# Patient Record
Sex: Female | Born: 1950 | Race: Black or African American | Hispanic: No | Smoking: Never smoker
Health system: Southern US, Community
[De-identification: ages and names within clinical notes are randomized; demographics above are authoritative.]

## PROBLEM LIST (undated history)

## (undated) DIAGNOSIS — I10 Essential (primary) hypertension: Secondary | ICD-10-CM

## (undated) DIAGNOSIS — G4730 Sleep apnea, unspecified: Secondary | ICD-10-CM

## (undated) DIAGNOSIS — E7800 Pure hypercholesterolemia, unspecified: Secondary | ICD-10-CM

## (undated) DIAGNOSIS — E559 Vitamin D deficiency, unspecified: Secondary | ICD-10-CM

## (undated) DIAGNOSIS — E119 Type 2 diabetes mellitus without complications: Secondary | ICD-10-CM

## (undated) HISTORY — PX: CHOLECYSTECTOMY: SHX55

## (undated) HISTORY — PX: OTHER SURGICAL HISTORY: SHX169

## (undated) HISTORY — PX: ABDOMINAL HYSTERECTOMY: SHX81

## (undated) HISTORY — PX: KNEE SURGERY: SHX244

---

## 2012-04-23 ENCOUNTER — Encounter (HOSPITAL_BASED_OUTPATIENT_CLINIC_OR_DEPARTMENT_OTHER): Payer: Self-pay | Admitting: *Deleted

## 2012-04-23 ENCOUNTER — Emergency Department (HOSPITAL_BASED_OUTPATIENT_CLINIC_OR_DEPARTMENT_OTHER)
Admission: EM | Admit: 2012-04-23 | Discharge: 2012-04-23 | Disposition: A | Discharge status: Home / Self Care | Payer: Medicare Other | Attending: Emergency Medicine | Admitting: Emergency Medicine

## 2012-04-23 DIAGNOSIS — Z79899 Other long term (current) drug therapy: Secondary | ICD-10-CM | POA: Insufficient documentation

## 2012-04-23 DIAGNOSIS — B029 Zoster without complications: Secondary | ICD-10-CM | POA: Insufficient documentation

## 2012-04-23 MED ORDER — MORPHINE SULFATE 4 MG/ML IJ SOLN
4.0000 mg | Freq: Once | INTRAMUSCULAR | Status: AC
  Administered 2012-04-23: 4 mg via INTRAMUSCULAR
  Filled 2012-04-23: qty 1

## 2012-04-23 MED ORDER — OXYCODONE-ACETAMINOPHEN 5-325 MG PO TABS: 1.0000 | ORAL_TABLET | ORAL | Status: AC | PRN

## 2012-04-23 NOTE — ED Notes (Signed)
Pain in her left upper quad around to her back x 2 weeks. She developed a rash and was diagnosed with shingles by her MD. She is taking Zovarax, Vicodin and Flexeril with no relief.

## 2012-04-23 NOTE — ED Provider Notes (Signed)
History/physical exam/procedure(s) were performed by non-physician practitioner and as supervising physician I was immediately available for consultation/collaboration. I have reviewed all notes and am in agreement with care and plan.   Danielle S Ray, MD 04/23/12 1448 

## 2012-04-23 NOTE — ED Provider Notes (Signed)
History     CSN: [id number]  Arrival date & time 04/23/12  1104   First MD Initiated Contact with Patient 04/23/12 1153      Chief Complaint  Patient presents with  . Rash    (Consider location/radiation/quality/duration/timing/severity/associated sxs/prior treatment) HPI Comments: Pt states that Cindy Ashley has been having pain times 2 weeks:pt states that initially Cindy Ashley was told that it was muscle strain and then Cindy Ashley was diagnosed with shingles and placed on valtrex, and vicodin:pt states that the pain medication is not working  Patient is a 62 y.o. female presenting with rash.  Rash Location:  Torso Torso rash location:  L flank Quality: blistering and painful   Pain details:    Quality:  Aching   Severity:  Severe   Duration:  2 weeks   No past medical history on file.  Past Surgical History  Procedure Laterality Date  . Abdominal hysterectomy    . Cholecystectomy    . Knee surgery    . Carpel tunnel      No family history on file.  History  Substance Use Topics  . Smoking status: Never Smoker   . Smokeless tobacco: Not on file  . Alcohol Use: Yes    OB History   Grav Para Term Preterm Abortions TAB SAB Ect Mult Living                  Review of Systems  Constitutional: Negative.   Respiratory: Negative.   Cardiovascular: Negative.   Skin: Positive for rash.    Allergies  Penicillins  Home Medications   Current Outpatient Rx  Name  Route  Sig  Dispense  Refill  . Acyclovir (ZOVIRAX PO)   Oral   Take by mouth.         . Cyclobenzaprine HCl (FLEXERIL PO)   Oral   Take by mouth.         . Hydrocodone-Acetaminophen (VICODIN PO)   Oral   Take by mouth.         Marland Kitchen LISINOPRIL PO   Oral   Take by mouth.         . Pitavastatin Calcium (LIVALO PO)   Oral   Take by mouth.         . SitaGLIPtin-MetFORMIN HCl (JANUMET PO)   Oral   Take by mouth.         . oxyCODONE-acetaminophen (PERCOCET/ROXICET) 5-325 MG per tablet   Oral   Take  1-2 tablets by mouth every 4 (four) hours as needed for pain.   15 tablet   0     BP 160/87  Pulse 80  Temp(Src) 98.6 F (37 C) (Oral)  Resp 22  Wt 273 lb (123.832 kg)  SpO2 97%  Physical Exam  Nursing note and vitals reviewed. Constitutional: Cindy Ashley is oriented to person, place, and time. Cindy Ashley appears well-developed and well-nourished.  HENT:  Head: Normocephalic and atraumatic.  Cardiovascular: Normal rate and regular rhythm.   Pulmonary/Chest: Effort normal and breath sounds normal.  Musculoskeletal: Normal range of motion.  Neurological: Cindy Ashley is alert and oriented to person, place, and time.  Skin:  Pt has red blistered area to the left flank    ED Course  Procedures (including critical care time)  Labs Reviewed - No data to display No results found.   1. Shingles       MDM  Will change pain medication       Teressa Lower, NP 04/23/12 1229

## 2012-10-30 ENCOUNTER — Other Ambulatory Visit (HOSPITAL_BASED_OUTPATIENT_CLINIC_OR_DEPARTMENT_OTHER): Payer: Self-pay | Admitting: Internal Medicine

## 2012-10-30 DIAGNOSIS — M25511 Pain in right shoulder: Secondary | ICD-10-CM

## 2012-11-02 ENCOUNTER — Other Ambulatory Visit (HOSPITAL_BASED_OUTPATIENT_CLINIC_OR_DEPARTMENT_OTHER): Payer: Medicare Other

## 2012-11-09 ENCOUNTER — Ambulatory Visit (HOSPITAL_BASED_OUTPATIENT_CLINIC_OR_DEPARTMENT_OTHER): Payer: Medicare Other

## 2012-12-03 ENCOUNTER — Ambulatory Visit (HOSPITAL_BASED_OUTPATIENT_CLINIC_OR_DEPARTMENT_OTHER)
Admission: RE | Admit: 2012-12-03 | Discharge: 2012-12-03 | Disposition: A | Discharge status: Home / Self Care | Payer: Medicare Other | Source: Ambulatory Visit | Attending: Internal Medicine | Admitting: Internal Medicine

## 2012-12-03 DIAGNOSIS — M24119 Other articular cartilage disorders, unspecified shoulder: Secondary | ICD-10-CM | POA: Insufficient documentation

## 2012-12-03 DIAGNOSIS — M6740 Ganglion, unspecified site: Secondary | ICD-10-CM | POA: Insufficient documentation

## 2012-12-03 DIAGNOSIS — M19019 Primary osteoarthritis, unspecified shoulder: Secondary | ICD-10-CM | POA: Insufficient documentation

## 2012-12-03 DIAGNOSIS — M25511 Pain in right shoulder: Secondary | ICD-10-CM

## 2012-12-03 DIAGNOSIS — M67919 Unspecified disorder of synovium and tendon, unspecified shoulder: Secondary | ICD-10-CM | POA: Insufficient documentation

## 2012-12-03 DIAGNOSIS — M719 Bursopathy, unspecified: Secondary | ICD-10-CM | POA: Insufficient documentation

## 2014-06-10 ENCOUNTER — Emergency Department (HOSPITAL_BASED_OUTPATIENT_CLINIC_OR_DEPARTMENT_OTHER): Payer: Medicare Other

## 2014-06-10 ENCOUNTER — Emergency Department (HOSPITAL_BASED_OUTPATIENT_CLINIC_OR_DEPARTMENT_OTHER)
Admission: EM | Admit: 2014-06-10 | Discharge: 2014-06-10 | Disposition: A | Discharge status: Home / Self Care | Payer: Medicare Other | Attending: Emergency Medicine | Admitting: Emergency Medicine

## 2014-06-10 ENCOUNTER — Encounter (HOSPITAL_BASED_OUTPATIENT_CLINIC_OR_DEPARTMENT_OTHER): Payer: Self-pay

## 2014-06-10 DIAGNOSIS — E119 Type 2 diabetes mellitus without complications: Secondary | ICD-10-CM | POA: Diagnosis not present

## 2014-06-10 DIAGNOSIS — R109 Unspecified abdominal pain: Secondary | ICD-10-CM | POA: Diagnosis not present

## 2014-06-10 DIAGNOSIS — Z9049 Acquired absence of other specified parts of digestive tract: Secondary | ICD-10-CM | POA: Diagnosis not present

## 2014-06-10 DIAGNOSIS — Z880 Allergy status to penicillin: Secondary | ICD-10-CM | POA: Insufficient documentation

## 2014-06-10 DIAGNOSIS — Z8669 Personal history of other diseases of the nervous system and sense organs: Secondary | ICD-10-CM | POA: Diagnosis not present

## 2014-06-10 DIAGNOSIS — Z90710 Acquired absence of both cervix and uterus: Secondary | ICD-10-CM | POA: Diagnosis not present

## 2014-06-10 DIAGNOSIS — M545 Low back pain: Secondary | ICD-10-CM | POA: Insufficient documentation

## 2014-06-10 DIAGNOSIS — Z794 Long term (current) use of insulin: Secondary | ICD-10-CM | POA: Diagnosis not present

## 2014-06-10 DIAGNOSIS — I10 Essential (primary) hypertension: Secondary | ICD-10-CM | POA: Insufficient documentation

## 2014-06-10 DIAGNOSIS — Z79899 Other long term (current) drug therapy: Secondary | ICD-10-CM | POA: Insufficient documentation

## 2014-06-10 HISTORY — DX: Vitamin D deficiency, unspecified: E55.9

## 2014-06-10 HISTORY — DX: Type 2 diabetes mellitus without complications: E11.9

## 2014-06-10 HISTORY — DX: Essential (primary) hypertension: I10

## 2014-06-10 HISTORY — DX: Sleep apnea, unspecified: G47.30

## 2014-06-10 LAB — CBC WITH DIFFERENTIAL/PLATELET
BASOS ABS: 0 10*3/uL (ref 0.0–0.1)
Basophils Relative: 0 % (ref 0–1)
EOS ABS: 0.1 10*3/uL (ref 0.0–0.7)
Eosinophils Relative: 1 % (ref 0–5)
HCT: 40.1 % (ref 36.0–46.0)
Hemoglobin: 12.7 g/dL (ref 12.0–15.0)
LYMPHS ABS: 2.6 10*3/uL (ref 0.7–4.0)
Lymphocytes Relative: 33 % (ref 12–46)
MCH: 27.3 pg (ref 26.0–34.0)
MCHC: 31.7 g/dL (ref 30.0–36.0)
MCV: 86.1 fL (ref 78.0–100.0)
Monocytes Absolute: 0.6 10*3/uL (ref 0.1–1.0)
Monocytes Relative: 7 % (ref 3–12)
NEUTROS PCT: 59 % (ref 43–77)
Neutro Abs: 4.7 10*3/uL (ref 1.7–7.7)
PLATELETS: 268 10*3/uL (ref 150–400)
RBC: 4.66 MIL/uL (ref 3.87–5.11)
RDW: 14.5 % (ref 11.5–15.5)
WBC: 7.9 10*3/uL (ref 4.0–10.5)

## 2014-06-10 LAB — COMPREHENSIVE METABOLIC PANEL
ALT: 18 U/L (ref 0–35)
AST: 19 U/L (ref 0–37)
Albumin: 3.7 g/dL (ref 3.5–5.2)
Alkaline Phosphatase: 58 U/L (ref 39–117)
Anion gap: 6 (ref 5–15)
BUN: 17 mg/dL (ref 6–23)
CALCIUM: 9 mg/dL (ref 8.4–10.5)
CO2: 26 mmol/L (ref 19–32)
Chloride: 107 mmol/L (ref 96–112)
Creatinine, Ser: 0.78 mg/dL (ref 0.50–1.10)
GFR calc non Af Amer: 86 mL/min — ABNORMAL LOW (ref >90)
GLUCOSE: 130 mg/dL — AB (ref 70–99)
POTASSIUM: 3.7 mmol/L (ref 3.5–5.1)
Sodium: 139 mmol/L (ref 135–145)
TOTAL PROTEIN: 7.5 g/dL (ref 6.0–8.3)
Total Bilirubin: 0.6 mg/dL (ref 0.3–1.2)

## 2014-06-10 LAB — URINALYSIS, ROUTINE W REFLEX MICROSCOPIC
BILIRUBIN URINE: NEGATIVE
Glucose, UA: NEGATIVE mg/dL
Hgb urine dipstick: NEGATIVE
KETONES UR: NEGATIVE mg/dL
LEUKOCYTES UA: NEGATIVE
NITRITE: NEGATIVE
PROTEIN: NEGATIVE mg/dL
Specific Gravity, Urine: 1.03 (ref 1.005–1.030)
Urobilinogen, UA: 0.2 mg/dL (ref 0.0–1.0)
pH: 5 (ref 5.0–8.0)

## 2014-06-10 LAB — CBG MONITORING, ED: Glucose-Capillary: 133 mg/dL — ABNORMAL HIGH (ref 70–99)

## 2014-06-10 LAB — TROPONIN I

## 2014-06-10 NOTE — Discharge Instructions (Signed)
Flank Pain °Flank pain refers to pain that is located on the side of the body between the upper abdomen and the back. The pain may occur over a short period of time (acute) or may be long-term or reoccurring (chronic). It may be mild or severe. Flank pain can be caused by many things. °CAUSES  °Some of the more common causes of flank pain include: °· Muscle strains.   °· Muscle spasms.   °· A disease of your spine (vertebral disk disease).   °· A lung infection (pneumonia).   °· Fluid around your lungs (pulmonary edema).   °· A kidney infection.   °· Kidney stones.   °· A very painful skin rash caused by the chickenpox virus (shingles).   °· Gallbladder disease.   °HOME CARE INSTRUCTIONS  °Home care will depend on the cause of your pain. In general, °· Rest as directed by your caregiver. °· Drink enough fluids to keep your urine clear or pale yellow. °· Only take over-the-counter or prescription medicines as directed by your caregiver. Some medicines may help relieve the pain. °· Tell your caregiver about any changes in your pain. °· Follow up with your caregiver as directed. °SEEK IMMEDIATE MEDICAL CARE IF:  °· Your pain is not controlled with medicine.   °· You have new or worsening symptoms. °· Your pain increases.   °· You have abdominal pain.   °· You have shortness of breath.   °· You have persistent nausea or vomiting.   °· You have swelling in your abdomen.   °· You feel faint or pass out.   °· You have blood in your urine. °· You have a fever or persistent symptoms for more than 2-3 days. °· You have a fever and your symptoms suddenly get worse. °MAKE SURE YOU:  °· Understand these instructions. °· Will watch your condition. °· Will get help right away if you are not doing well or get worse. °Document Released: 03/30/2005 Document Revised: 11/01/2011 Document Reviewed: 09/21/2011 °[employer]® Patient Information ©2015 [employer]. This information is not intended to replace advice given to you by your  health care provider. Make sure you discuss any questions you have with your health care provider. ° °

## 2014-06-10 NOTE — ED Notes (Signed)
MD at bedside. 

## 2014-06-10 NOTE — ED Notes (Signed)
Pt reports she only takes her lantus and janumet.

## 2014-06-10 NOTE — ED Notes (Signed)
Pt reports right flank pain that started two weeks ago. Pt reports frequent urination.  Pt reports lightheadedness.  Pt reports weakness.  Also reports Sunday started having diarrhea but it stopped after eating cheese pizza.

## 2014-06-10 NOTE — ED Notes (Signed)
Pt at CT, EKG will be completed after Pt returns

## 2014-06-10 NOTE — ED Provider Notes (Signed)
CSN: [id number]     Arrival date & time 06/10/14  1524 History   First MD Initiated Contact with Patient 06/10/14 1601     Chief Complaint  Patient presents with  . Flank Pain     Patient is a 64 y.o. female presenting with flank pain. The history is provided by the patient. No language interpreter was used.  Flank Pain   Cindy Ashley presents for evaluation of right flank pain. She reports several weeks of intermittent right flank pain described as a soreness. She also endorses increased urinary frequency. She denies any dysuria or hematuria. She has some soreness that goes up to her right side of her chest. She had diarrhea several days ago, this is now improved. She denies any fevers, vomiting, abdominal pain. She does have some soreness in her chest with movement. The pain in her side is worse with movement. She denies any similar previous symptoms. Symptoms are moderate, constant, worsening.  Past Medical History  Diagnosis Date  . Diabetes mellitus without complication   . Sleep apnea   . Vitamin D deficiency   . Hypertension    Past Surgical History  Procedure Laterality Date  . Abdominal hysterectomy    . Cholecystectomy    . Knee surgery    . Carpel tunnel     No family history on file. History  Substance Use Topics  . Smoking status: Never Smoker   . Smokeless tobacco: Not on file  . Alcohol Use: Yes   OB History    No data available     Review of Systems  Genitourinary: Positive for flank pain.  All other systems reviewed and are negative.     Allergies  Penicillins  Home Medications   Prior to Admission medications   Medication Sig Start Date End Date Taking? Authorizing Provider  insulin glargine (LANTUS) 100 UNIT/ML injection Inject into the skin at bedtime.   Yes Historical Provider, MD  Acyclovir (ZOVIRAX PO) Take by mouth.    Historical Provider, MD  Cyclobenzaprine HCl (FLEXERIL PO) Take by mouth.    Historical Provider, MD   Hydrocodone-Acetaminophen (VICODIN PO) Take by mouth.    Historical Provider, MD  LISINOPRIL PO Take by mouth.    Historical Provider, MD  oxyCODONE-acetaminophen (PERCOCET/ROXICET) 5-325 MG per tablet Take 1-2 tablets by mouth every 4 (four) hours as needed for pain. 04/23/12   Teressa LowerVrinda Pickering, NP  Pitavastatin Calcium (LIVALO PO) Take by mouth.    Historical Provider, MD  SitaGLIPtin-MetFORMIN HCl (JANUMET PO) Take by mouth.    Historical Provider, MD   BP 142/71 mmHg  Pulse 96  Temp(Src) 98.5 F (36.9 C) (Oral)  Resp 18  Ht 5\' 7"  (1.702 m)  Wt 264 lb (119.75 kg)  BMI 41.34 kg/m2  SpO2 100% Physical Exam  Constitutional: She is oriented to person, place, and time. She appears well-developed and well-nourished.  HENT:  Head: Normocephalic and atraumatic.  Cardiovascular: Normal rate and regular rhythm.   No murmur heard. Pulmonary/Chest: Effort normal and breath sounds normal. No respiratory distress. She exhibits no tenderness.  Abdominal: Soft. There is no tenderness. There is no rebound and no guarding.  Musculoskeletal: She exhibits no edema or tenderness.  Mild right mid and lower back tenderness to palpation.  Neurological: She is alert and oriented to person, place, and time.  Skin: Skin is warm and dry.  Psychiatric: She has a normal mood and affect. Her behavior is normal.  Nursing note and vitals reviewed.   ED  Course  Procedures (including critical care time) Labs Review Labs Reviewed  URINALYSIS, ROUTINE W REFLEX MICROSCOPIC - Abnormal; Notable for the following:    APPearance CLOUDY (*)    All other components within normal limits  COMPREHENSIVE METABOLIC PANEL - Abnormal; Notable for the following:    Glucose, Bld 130 (*)    GFR calc non Af Amer 86 (*)    All other components within normal limits  CBG MONITORING, ED - Abnormal; Notable for the following:    Glucose-Capillary 133 (*)    All other components within normal limits  CBC WITH  DIFFERENTIAL/PLATELET  TROPONIN I    Imaging Review Ct Renal Stone Study  06/10/2014   CLINICAL DATA:  Right flank pain  EXAM: CT ABDOMEN AND PELVIS WITHOUT CONTRAST  TECHNIQUE: Multidetector CT imaging of the abdomen and pelvis was performed following the standard protocol without IV contrast.  COMPARISON:  None.  FINDINGS: Lung bases are clear.  Heart size within normal limits  Liver and spleen are normal. Prior cholecystectomy. Bile ducts nondilated. Negative for pancreatic mass or edema.  Normal kidneys. No renal mass or obstruction. No renal calculi identified. Urinary bladder normal.  Negative for bowel obstruction. Pan diverticulosis of the colon. No evidence of acute diverticulitis.  Negative for mass or adenopathy. No free fluid. No focal skeletal lesion.  IMPRESSION: Pandiverticulosis without evidence of diverticulitis  No acute abnormality.   Electronically Signed   By: Marlan Palau M.D.   On: 06/10/2014 16:29     EKG Interpretation   Date/Time:  Wednesday June 10 2014 16:36:43 EDT Ventricular Rate:  81 PR Interval:  176 QRS Duration: 84 QT Interval:  372 QTC Calculation: 432 R Axis:   8 Text Interpretation:  Normal sinus rhythm Minimal voltage criteria for  LVH, may be normal variant Borderline ECG Confirmed by Lincoln Brigham [phone])  on 06/10/2014 5:09:14 PM      MDM   Final diagnoses:  Acute right flank pain   Patient here for evaluation of right flank pain. Patient is a known diabetic. CT abdomen performed to rule out obstructing renal stone. CT without any acute abnormalities. History of presentation is not consistent with ACS, PE, pneumonia, dissection. Discussed with patient home care for musculoskeletal side pain as well as return precautions.  Tilden Fossa, MD 06/10/14 [phone]

## 2014-07-28 ENCOUNTER — Emergency Department (HOSPITAL_BASED_OUTPATIENT_CLINIC_OR_DEPARTMENT_OTHER): Payer: Medicare Other

## 2014-07-28 ENCOUNTER — Emergency Department (HOSPITAL_BASED_OUTPATIENT_CLINIC_OR_DEPARTMENT_OTHER)
Admission: EM | Admit: 2014-07-28 | Discharge: 2014-07-28 | Disposition: A | Discharge status: Home / Self Care | Payer: Medicare Other | Attending: Emergency Medicine | Admitting: Emergency Medicine

## 2014-07-28 ENCOUNTER — Encounter (HOSPITAL_BASED_OUTPATIENT_CLINIC_OR_DEPARTMENT_OTHER): Payer: Self-pay | Admitting: Emergency Medicine

## 2014-07-28 ENCOUNTER — Other Ambulatory Visit: Payer: Self-pay

## 2014-07-28 DIAGNOSIS — I10 Essential (primary) hypertension: Secondary | ICD-10-CM | POA: Diagnosis not present

## 2014-07-28 DIAGNOSIS — G8929 Other chronic pain: Secondary | ICD-10-CM | POA: Diagnosis not present

## 2014-07-28 DIAGNOSIS — M549 Dorsalgia, unspecified: Secondary | ICD-10-CM

## 2014-07-28 DIAGNOSIS — E559 Vitamin D deficiency, unspecified: Secondary | ICD-10-CM | POA: Insufficient documentation

## 2014-07-28 DIAGNOSIS — Z794 Long term (current) use of insulin: Secondary | ICD-10-CM | POA: Insufficient documentation

## 2014-07-28 DIAGNOSIS — Z79899 Other long term (current) drug therapy: Secondary | ICD-10-CM | POA: Insufficient documentation

## 2014-07-28 DIAGNOSIS — E119 Type 2 diabetes mellitus without complications: Secondary | ICD-10-CM | POA: Diagnosis not present

## 2014-07-28 DIAGNOSIS — R109 Unspecified abdominal pain: Secondary | ICD-10-CM | POA: Diagnosis not present

## 2014-07-28 DIAGNOSIS — M546 Pain in thoracic spine: Secondary | ICD-10-CM | POA: Insufficient documentation

## 2014-07-28 DIAGNOSIS — Z8669 Personal history of other diseases of the nervous system and sense organs: Secondary | ICD-10-CM | POA: Insufficient documentation

## 2014-07-28 DIAGNOSIS — Z880 Allergy status to penicillin: Secondary | ICD-10-CM | POA: Diagnosis not present

## 2014-07-28 LAB — COMPREHENSIVE METABOLIC PANEL
ALT: 22 U/L (ref 14–54)
AST: 21 U/L (ref 15–41)
Albumin: 3.6 g/dL (ref 3.5–5.0)
Alkaline Phosphatase: 74 U/L (ref 38–126)
Anion gap: 7 (ref 5–15)
BILIRUBIN TOTAL: 0.2 mg/dL — AB (ref 0.3–1.2)
BUN: 12 mg/dL (ref 6–20)
CHLORIDE: 106 mmol/L (ref 101–111)
CO2: 27 mmol/L (ref 22–32)
Calcium: 9.1 mg/dL (ref 8.9–10.3)
Creatinine, Ser: 0.76 mg/dL (ref 0.44–1.00)
GLUCOSE: 159 mg/dL — AB (ref 65–99)
POTASSIUM: 3.3 mmol/L — AB (ref 3.5–5.1)
Sodium: 140 mmol/L (ref 135–145)
TOTAL PROTEIN: 7.6 g/dL (ref 6.5–8.1)

## 2014-07-28 LAB — CBC WITH DIFFERENTIAL/PLATELET
BASOS ABS: 0 10*3/uL (ref 0.0–0.1)
BASOS PCT: 0 % (ref 0–1)
Eosinophils Absolute: 0.1 10*3/uL (ref 0.0–0.7)
Eosinophils Relative: 1 % (ref 0–5)
HEMATOCRIT: 43.3 % (ref 36.0–46.0)
Hemoglobin: 13.7 g/dL (ref 12.0–15.0)
LYMPHS ABS: 2.9 10*3/uL (ref 0.7–4.0)
LYMPHS PCT: 32 % (ref 12–46)
MCH: 27.3 pg (ref 26.0–34.0)
MCHC: 31.6 g/dL (ref 30.0–36.0)
MCV: 86.4 fL (ref 78.0–100.0)
Monocytes Absolute: 0.6 10*3/uL (ref 0.1–1.0)
Monocytes Relative: 7 % (ref 3–12)
NEUTROS PCT: 60 % (ref 43–77)
Neutro Abs: 5.4 10*3/uL (ref 1.7–7.7)
Platelets: 272 10*3/uL (ref 150–400)
RBC: 5.01 MIL/uL (ref 3.87–5.11)
RDW: 14.4 % (ref 11.5–15.5)
WBC: 9 10*3/uL (ref 4.0–10.5)

## 2014-07-28 LAB — URINALYSIS, ROUTINE W REFLEX MICROSCOPIC
Bilirubin Urine: NEGATIVE
GLUCOSE, UA: NEGATIVE mg/dL
Hgb urine dipstick: NEGATIVE
KETONES UR: NEGATIVE mg/dL
Leukocytes, UA: NEGATIVE
NITRITE: NEGATIVE
Protein, ur: NEGATIVE mg/dL
SPECIFIC GRAVITY, URINE: 1.024 (ref 1.005–1.030)
Urobilinogen, UA: 0.2 mg/dL (ref 0.0–1.0)
pH: 5 (ref 5.0–8.0)

## 2014-07-28 MED ORDER — OXYCODONE HCL 5 MG PO TABS: ORAL_TABLET | ORAL | Status: AC

## 2014-07-28 MED ORDER — CYCLOBENZAPRINE HCL 10 MG PO TABS: 10.0000 mg | ORAL_TABLET | Freq: Two times a day (BID) | ORAL | Status: DC | PRN

## 2014-07-28 NOTE — ED Provider Notes (Signed)
CSN: [id number]     Arrival date & time 07/28/14  4098 History   First MD Initiated Contact with Patient 07/28/14 [phone]     Chief Complaint  Patient presents with  . Flank Pain    right side     (Consider location/radiation/quality/duration/timing/severity/associated sxs/prior Treatment) Patient is a 64 y.o. female presenting with flank pain. The history is provided by the patient.  Flank Pain This is a chronic problem. Episode onset: 1.5 mos ago. Episode frequency: intermittent. The problem has not changed since onset.Pertinent negatives include no chest pain, no abdominal pain, no headaches and no shortness of breath. The symptoms are aggravated by twisting and bending. Relieved by: certain positions. Treatments tried: opana. The treatment provided mild relief.    Past Medical History  Diagnosis Date  . Diabetes mellitus without complication   . Sleep apnea   . Vitamin D deficiency   . Hypertension    Past Surgical History  Procedure Laterality Date  . Abdominal hysterectomy    . Cholecystectomy    . Knee surgery    . Carpel tunnel     History reviewed. No pertinent family history. History  Substance Use Topics  . Smoking status: Never Smoker   . Smokeless tobacco: Not on file  . Alcohol Use: Yes   OB History    No data available     Review of Systems  Constitutional: Negative for fever and fatigue.  HENT: Negative for congestion and drooling.   Eyes: Negative for pain.  Respiratory: Negative for cough and shortness of breath.   Cardiovascular: Negative for chest pain.  Gastrointestinal: Negative for nausea, vomiting, abdominal pain and diarrhea.  Genitourinary: Positive for flank pain. Negative for dysuria and hematuria.  Musculoskeletal: Positive for back pain. Negative for gait problem and neck pain.  Skin: Negative for color change.  Neurological: Negative for dizziness and headaches.  Hematological: Negative for adenopathy.  Psychiatric/Behavioral: Negative  for behavioral problems.  All other systems reviewed and are negative.     Allergies  Penicillins  Home Medications   Prior to Admission medications   Medication Sig Start Date End Date Taking? Authorizing Provider  Acyclovir (ZOVIRAX PO) Take by mouth.    Historical Provider, MD  Cyclobenzaprine HCl (FLEXERIL PO) Take by mouth.    Historical Provider, MD  Hydrocodone-Acetaminophen (VICODIN PO) Take by mouth.    Historical Provider, MD  insulin glargine (LANTUS) 100 UNIT/ML injection Inject into the skin at bedtime.    Historical Provider, MD  LISINOPRIL PO Take by mouth.    Historical Provider, MD  oxyCODONE-acetaminophen (PERCOCET/ROXICET) 5-325 MG per tablet Take 1-2 tablets by mouth every 4 (four) hours as needed for pain. 04/23/12   Cindy Lower, NP  Pitavastatin Calcium (LIVALO PO) Take by mouth.    Historical Provider, MD  SitaGLIPtin-MetFORMIN HCl (JANUMET PO) Take by mouth.    Historical Provider, MD   BP 168/81 mmHg  Pulse 80  Temp(Src) 98 F (36.7 C) (Oral)  Resp 18  Ht  (1.702 m)  Wt 262 lb (118.842 kg)  BMI 41.03 kg/m2  SpO2 100% Physical Exam  Constitutional: She is oriented to person, place, and time. She appears well-developed and well-nourished.  HENT:  Head: Normocephalic.  Mouth/Throat: Oropharynx is clear and moist. No oropharyngeal exudate.  Eyes: Conjunctivae and EOM are normal. Pupils are equal, round, and reactive to light.  Neck: Normal range of motion. Neck supple.  Cardiovascular: Normal rate, regular rhythm, normal heart sounds and intact distal pulses.  Exam  reveals no gallop and no friction rub.   No murmur heard. Pulmonary/Chest: Effort normal and breath sounds normal. No respiratory distress. She has no wheezes.  Abdominal: Soft. Bowel sounds are normal. There is no tenderness. There is no rebound and no guarding.  Musculoskeletal: Normal range of motion. She exhibits no edema or tenderness.  Normal strength and sensation in bilateral  Ashley extremities.  2+ distal pulses in Ashley extremities.  Tenderness to palpation of the right parathoracic area.  No vertebral tenderness.  Reproduction of symptoms with twisting of the torso.  Neurological: She is alert and oriented to person, place, and time.  Skin: Skin is warm and dry.  Psychiatric: She has a normal mood and affect. Her behavior is normal.  Nursing note and vitals reviewed.   ED Course  Procedures (including critical care time) Labs Review Labs Reviewed  COMPREHENSIVE METABOLIC PANEL - Abnormal; Notable for the following:    Potassium 3.3 (*)    Glucose, Bld 159 (*)    Total Bilirubin 0.2 (*)    All other components within normal limits  CBC WITH DIFFERENTIAL/PLATELET  URINALYSIS, ROUTINE W REFLEX MICROSCOPIC (NOT [facility])    Imaging Review Dg Chest 2 View  07/28/2014   CLINICAL DATA:  RIGHT posterior back pain.  Cough.  EXAM: CHEST  2 VIEW  COMPARISON:  None.  FINDINGS: Cardiopericardial silhouette within normal limits. Mediastinal contours normal. Trachea midline. No airspace disease or effusion.  IMPRESSION: No active cardiopulmonary disease.   Electronically Signed   By: Cindy Ashley  Lamke M.D.   On: 07/28/2014 10:18     EKG Interpretation   Date/Time:  Tuesday July 28 2014 09:54:37 EDT Ventricular Rate:  78 PR Interval:  178 QRS Duration: 86 QT Interval:  380 QTC Calculation: 433 R Axis:   54 Text Interpretation:  Normal sinus rhythm Normal ECG No significant change  since last tracing Confirmed by Cindy Ralston  MD, Cindy Ashley ([id number]) on 07/28/2014  9:57:07 AM      MDM   Final diagnoses:  Back pain    9:52 AM 64 y.o. female with a history of diabetes, hypertension who presents with right sided parathoracic pain which has been going on for about 1.5 months. She was seen here back in April for this pain and had a noncontributory workup including lab work, troponin, EKG, and CT stone study. She describes the pain as a muscle aching sensation which is  worse with palpation and twisting of her torso. She denies any shortness of breath or chest pain. Vital signs unremarkable here. Pain reproducible on exam. No vertebral tenderness. She is currently on Opana which is prescribed by her doctor but takes it irregularly. She denies any GU symptoms. I suspect that her pain is likely muscular skeletal in origin. Do not think this is a PE given lack of chest pain and shortness of breath. We'll get chest x-ray and she does have a mild cough to rule out any Ashley lobe infiltrate as the cause of her pain. We'll also get screening lab work. She denies any BP red flags. Does have DM.   10:40 AM: I interpreted/reviewed the labs and/or imaging which were non-contributory.   I have discussed the diagnosis/risks/treatment options with the patient and believe the pt to be eligible for discharge home to follow-up with her pcp. We also discussed returning to the ED immediately if new or worsening sx occur. We discussed the sx which are most concerning (e.g., worsening pain, fever, weakness, numbness) that necessitate immediate return.  Medications administered to the patient during their visit and any new prescriptions provided to the patient are listed below.  Medications given during this visit Medications - No data to display  New Prescriptions   CYCLOBENZAPRINE (FLEXERIL) 10 MG TABLET    Take 1 tablet (10 mg total) by mouth 2 (two) times daily as needed for muscle spasms.   OXYCODONE (ROXICODONE) 5 MG IMMEDIATE RELEASE TABLET    Take 1-2 tablets every 4-6 hours as needed for pain.       Purvis Sheffield, MD 07/28/14 1041

## 2014-07-28 NOTE — ED Notes (Signed)
Pt states pain has been going on for over a month, has been seen here for it, saw her pcp  For it and has had no relieve.

## 2014-07-28 NOTE — ED Notes (Signed)
Patient transported to X-ray 

## 2014-07-28 NOTE — ED Notes (Addendum)
Pt a/o x 3, c/o right lower back pain, tender to touch, hurts more when she walks.

## 2014-07-28 NOTE — ED Notes (Signed)
MD at bedside. 

## 2015-08-26 ENCOUNTER — Emergency Department (HOSPITAL_BASED_OUTPATIENT_CLINIC_OR_DEPARTMENT_OTHER)
Admission: EM | Admit: 2015-08-26 | Discharge: 2015-08-26 | Disposition: A | Discharge status: Home / Self Care | Payer: Medicare Other | Attending: Emergency Medicine | Admitting: Emergency Medicine

## 2015-08-26 ENCOUNTER — Emergency Department (HOSPITAL_BASED_OUTPATIENT_CLINIC_OR_DEPARTMENT_OTHER): Payer: Medicare Other

## 2015-08-26 ENCOUNTER — Encounter (HOSPITAL_BASED_OUTPATIENT_CLINIC_OR_DEPARTMENT_OTHER): Payer: Self-pay | Admitting: *Deleted

## 2015-08-26 DIAGNOSIS — I10 Essential (primary) hypertension: Secondary | ICD-10-CM | POA: Diagnosis not present

## 2015-08-26 DIAGNOSIS — M7989 Other specified soft tissue disorders: Secondary | ICD-10-CM | POA: Diagnosis not present

## 2015-08-26 DIAGNOSIS — Z79899 Other long term (current) drug therapy: Secondary | ICD-10-CM | POA: Insufficient documentation

## 2015-08-26 DIAGNOSIS — J302 Other seasonal allergic rhinitis: Secondary | ICD-10-CM

## 2015-08-26 DIAGNOSIS — E119 Type 2 diabetes mellitus without complications: Secondary | ICD-10-CM | POA: Insufficient documentation

## 2015-08-26 DIAGNOSIS — Z794 Long term (current) use of insulin: Secondary | ICD-10-CM | POA: Insufficient documentation

## 2015-08-26 DIAGNOSIS — R0981 Nasal congestion: Secondary | ICD-10-CM

## 2015-08-26 DIAGNOSIS — J029 Acute pharyngitis, unspecified: Secondary | ICD-10-CM | POA: Diagnosis present

## 2015-08-26 LAB — CBC WITH DIFFERENTIAL/PLATELET
Basophils Absolute: 0 10*3/uL (ref 0.0–0.1)
Basophils Relative: 0 %
Eosinophils Absolute: 0.1 10*3/uL (ref 0.0–0.7)
Eosinophils Relative: 1 %
HCT: 41.7 % (ref 36.0–46.0)
Hemoglobin: 13.6 g/dL (ref 12.0–15.0)
LYMPHS PCT: 32 %
Lymphs Abs: 2.8 10*3/uL (ref 0.7–4.0)
MCH: 27.9 pg (ref 26.0–34.0)
MCHC: 32.6 g/dL (ref 30.0–36.0)
MCV: 85.6 fL (ref 78.0–100.0)
MONO ABS: 0.7 10*3/uL (ref 0.1–1.0)
MONOS PCT: 8 %
NEUTROS ABS: 5.1 10*3/uL (ref 1.7–7.7)
Neutrophils Relative %: 59 %
PLATELETS: 272 10*3/uL (ref 150–400)
RBC: 4.87 MIL/uL (ref 3.87–5.11)
RDW: 15 % (ref 11.5–15.5)
WBC: 8.7 10*3/uL (ref 4.0–10.5)

## 2015-08-26 LAB — COMPREHENSIVE METABOLIC PANEL
ALBUMIN: 3.8 g/dL (ref 3.5–5.0)
ALT: 19 U/L (ref 14–54)
ANION GAP: 7 (ref 5–15)
AST: 21 U/L (ref 15–41)
Alkaline Phosphatase: 64 U/L (ref 38–126)
BUN: 10 mg/dL (ref 6–20)
CHLORIDE: 106 mmol/L (ref 101–111)
CO2: 26 mmol/L (ref 22–32)
Calcium: 9.1 mg/dL (ref 8.9–10.3)
Creatinine, Ser: 0.75 mg/dL (ref 0.44–1.00)
GFR calc Af Amer: >60 mL/min (ref >60)
GFR calc non Af Amer: >60 mL/min (ref >60)
GLUCOSE: 123 mg/dL — AB (ref 65–99)
POTASSIUM: 3.8 mmol/L (ref 3.5–5.1)
SODIUM: 139 mmol/L (ref 135–145)
TOTAL PROTEIN: 7.7 g/dL (ref 6.5–8.1)
Total Bilirubin: 0.5 mg/dL (ref 0.3–1.2)

## 2015-08-26 LAB — RAPID STREP SCREEN (MED CTR MEBANE ONLY): Streptococcus, Group A Screen (Direct): NEGATIVE

## 2015-08-26 LAB — CBG MONITORING, ED: GLUCOSE-CAPILLARY: 116 mg/dL — AB (ref 65–99)

## 2015-08-26 NOTE — ED Notes (Signed)
Pt c/o sore throat x 5 days.

## 2015-08-26 NOTE — ED Provider Notes (Signed)
CSN: [id number]     Arrival date & time 08/26/15  1927 History   By signing my name below, I, Suzan SlickAshley N. Elon SpannerLeger, attest that this documentation has been prepared under the direction and in the presence of Vanetta MuldersScott Ngozi Alvidrez, MD.  Electronically Signed: Suzan SlickAshley N. Elon SpannerLeger, ED Scribe. 08/26/2015. 9:22 PM.   Chief Complaint  Patient presents with  . Sore Throat   The history is provided by the patient. No language interpreter was used.    HPI Comments: Cindy Ashley is a 65 y.o. female with a PMHx of DM, seasonal allergies, and HTN who presents to the Emergency Department complaining of constant, unchanged sore throat x 5 days. She also reports a "swelling" sensation to the throat, cough, HA, postnasal drip, nausea, and myalgias. Symptoms are exacerbated at night time without any alleviating factors. Pt typically takes Flonase daily. However, she denies any improvement with recent symptoms. No recent fever, chills, nausea, vomiting, or abdominal pain. Pt checks her blood sugars every other day with an average reading of 150. Pt with known allergy to Penicillins.  PCP: Charlette CaffeyParuchuri, Lakshmi P, MD    Past Medical History  Diagnosis Date  . Diabetes mellitus without complication (HCC)   . Sleep apnea   . Vitamin D deficiency   . Hypertension    Past Surgical History  Procedure Laterality Date  . Abdominal hysterectomy    . Cholecystectomy    . Knee surgery    . Carpel tunnel     History reviewed. No pertinent family history. Social History  Substance Use Topics  . Smoking status: Never Smoker   . Smokeless tobacco: None  . Alcohol Use: Yes   OB History    No data available     Review of Systems  Constitutional: Negative for fever and chills.  HENT: Positive for postnasal drip and sore throat. Negative for rhinorrhea.   Eyes: Negative for visual disturbance.  Respiratory: Positive for shortness of breath. Negative for cough.   Cardiovascular: Positive for leg swelling. Negative for chest  pain.  Gastrointestinal: Positive for nausea. Negative for vomiting, abdominal pain and diarrhea.  Genitourinary: Negative for dysuria.  Musculoskeletal: Positive for myalgias. Negative for back pain and neck pain.  Skin: Negative for rash.  Neurological: Positive for headaches. Negative for dizziness and light-headedness.  Hematological: Does not bruise/bleed easily.  Psychiatric/Behavioral: Negative for confusion.      Allergies  Penicillins  Home Medications   Prior to Admission medications   Medication Sig Start Date End Date Taking? Authorizing Provider  Acyclovir (ZOVIRAX PO) Take by mouth.    Historical Provider, MD  cyclobenzaprine (FLEXERIL) 10 MG tablet Take 1 tablet (10 mg total) by mouth 2 (two) times daily as needed for muscle spasms. 07/28/14   Purvis SheffieldForrest Harrison, MD  Cyclobenzaprine HCl (FLEXERIL PO) Take by mouth.    Historical Provider, MD  Hydrocodone-Acetaminophen (VICODIN PO) Take by mouth.    Historical Provider, MD  insulin glargine (LANTUS) 100 UNIT/ML injection Inject into the skin at bedtime.    Historical Provider, MD  LISINOPRIL PO Take by mouth.    Historical Provider, MD  oxyCODONE (ROXICODONE) 5 MG immediate release tablet Take 1-2 tablets every 4-6 hours as needed for pain. 07/28/14   Purvis SheffieldForrest Harrison, MD  oxyCODONE-acetaminophen (PERCOCET/ROXICET) 5-325 MG per tablet Take 1-2 tablets by mouth every 4 (four) hours as needed for pain. 04/23/12   Teressa LowerVrinda Pickering, NP  Pitavastatin Calcium (LIVALO PO) Take by mouth.    Historical Provider, MD  SitaGLIPtin-MetFORMIN HCl (  JANUMET PO) Take by mouth.    Historical Provider, MD   Triage Vitals: BP 169/79 mmHg  Pulse 80  Temp(Src) 98.4 F (36.9 C) (Oral)  Resp 18  Ht 5\' 7"  (1.702 m)  Wt 120.203 kg  BMI 41.50 kg/m2  SpO2 99%   Physical Exam  Constitutional: She is oriented to person, place, and time. She appears well-developed and well-nourished. No distress.  HENT:  Head: Normocephalic and atraumatic.   Mouth/Throat: Oropharynx is clear and moist. No oropharyngeal exudate.  Uvula is midline   Eyes: EOM are normal.  Pupils are normal Sclera clear Eyes track normally   Neck: Normal range of motion.  Normal thyroid   Cardiovascular: Normal rate, regular rhythm and normal heart sounds.   Pulmonary/Chest: Effort normal and breath sounds normal.  Abdominal: Soft. She exhibits no distension. There is no tenderness.  Musculoskeletal: Normal range of motion.  No pitting edema noted   Lymphadenopathy:    She has no cervical adenopathy.  Neurological: She is alert and oriented to person, place, and time.  Skin: Skin is warm and dry.  Psychiatric: She has a normal mood and affect. Judgment normal.  Nursing note and vitals reviewed.   ED Course  Procedures (including critical care time)  DIAGNOSTIC STUDIES: Oxygen Saturation is 99% on RA, Normal by my interpretation.    COORDINATION OF CARE: 9:11 PM- Will order blood work and CXR. Discussed treatment plan with pt at bedside and pt agreed to plan.     Labs Review Labs Reviewed  COMPREHENSIVE METABOLIC PANEL - Abnormal; Notable for the following:    Glucose, Bld 123 (*)    All other components within normal limits  CBG MONITORING, ED - Abnormal; Notable for the following:    Glucose-Capillary 116 (*)    All other components within normal limits  RAPID STREP SCREEN (NOT AT [facility])  CULTURE, GROUP A STREP [facility])  CBC WITH DIFFERENTIAL/PLATELET    Imaging Review Dg Chest 2 View  08/26/2015  CLINICAL DATA:  Cough and shortness of breath, 4 days duration. EXAM: CHEST  2 VIEW COMPARISON:  07/28/2014 FINDINGS: Heart size is normal. Mediastinal shadows are normal. The lungs are clear. No bronchial thickening. No infiltrate, mass, effusion or collapse. Pulmonary vascularity is normal. No bony abnormality. IMPRESSION: Normal chest Electronically Signed   By: Paulina FusiMark  Shogry M.D.   On: 08/26/2015 21:56   I have personally reviewed and evaluated  these images and lab results as part of my medical decision-making.   EKG Interpretation None      MDM   Final diagnoses:  Seasonal allergic reaction  Nasal congestion    Extensive workup here without any acute findings. Suspect symptoms may be related to seasonal allergies or viral upper respiratory infection. No evidence of pneumonia. No evidence of strep pharyngitis. Would recommend continuing her Flonase and add on Zyrtec or  Claritin. Patient nontoxic no acute distress.  I personally performed the services described in this documentation, which was scribed in my presence. The recorded information has been reviewed and is accurate.     Vanetta MuldersScott Kimberlly Norgard, MD 08/26/15 [id number]

## 2015-08-26 NOTE — Discharge Instructions (Signed)
Continue your allergy medicine Flonase. May consider a other allergy medicine like Claritin or Zyrtec. Today's workup without any significant findings. Return for any new or worse symptoms.

## 2015-08-29 LAB — CULTURE, GROUP A STREP (THRC)

## 2016-05-13 IMAGING — CT CT RENAL STONE PROTOCOL
2 of 4 series · 17 of 46 positions shown, 19 images · non-contrast
Comparison: None.

CLINICAL DATA: Right flank pain

EXAM:
CT ABDOMEN AND PELVIS WITHOUT CONTRAST
TECHNIQUE: Multidetector CT imaging of the abdomen and pelvis was performed
following the standard protocol without IV contrast.

[Series 2: renal stone > 200 lbs 5.0 b31f · axial · 0.98mm/px · z∈[-444,-14]mm · 14 of 94 slices shown, 16 images]
[im 4/94  soft-tissue]
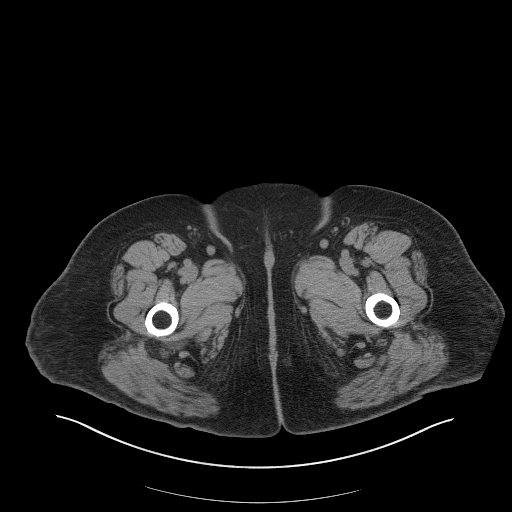
[im 4/94  bone]
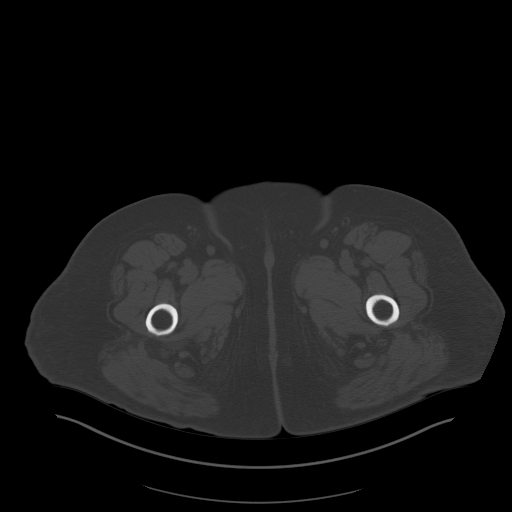
[im 12/94  soft-tissue]
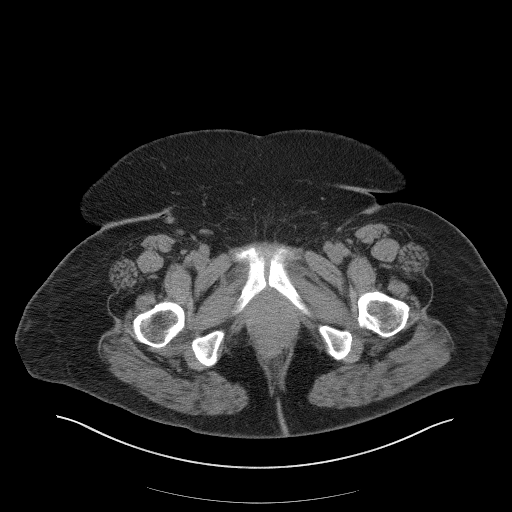
[im 20/94  soft-tissue]
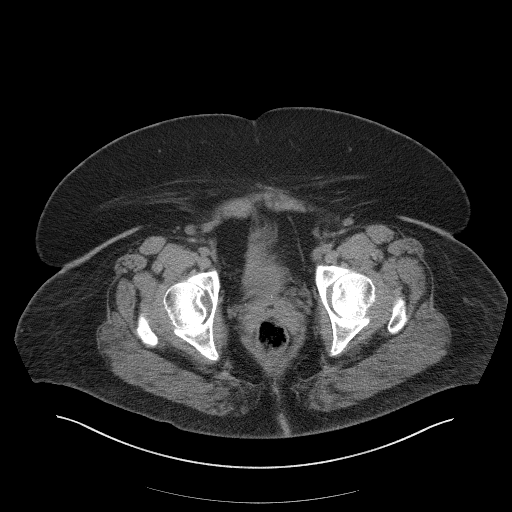
[im 24/94  soft-tissue]
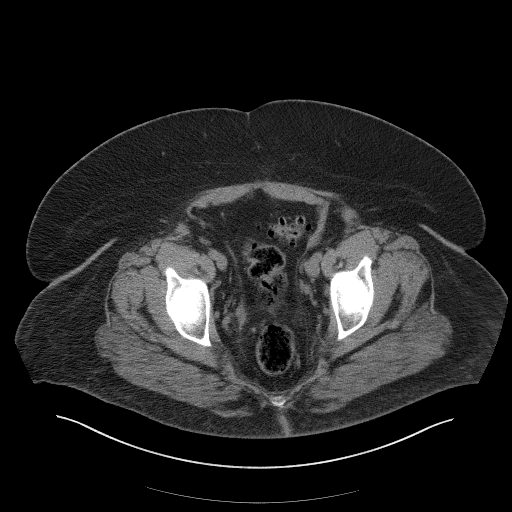
[im 32/94  soft-tissue]
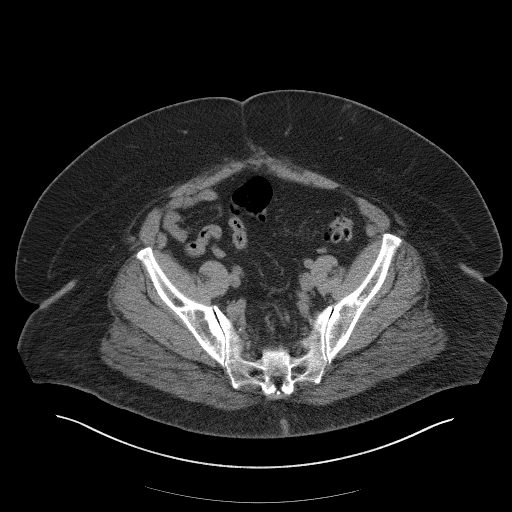
[im 39/94  soft-tissue]
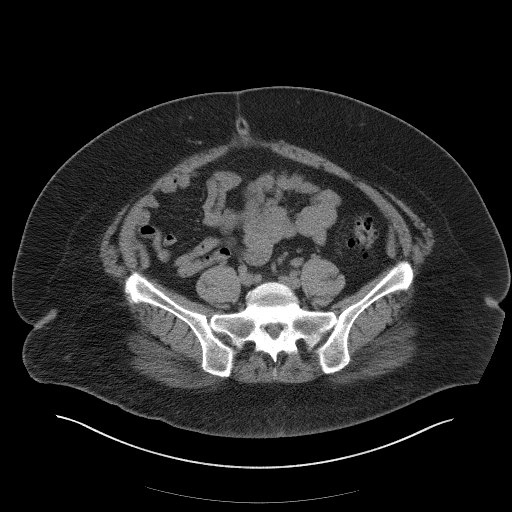
[im 43/94  soft-tissue]
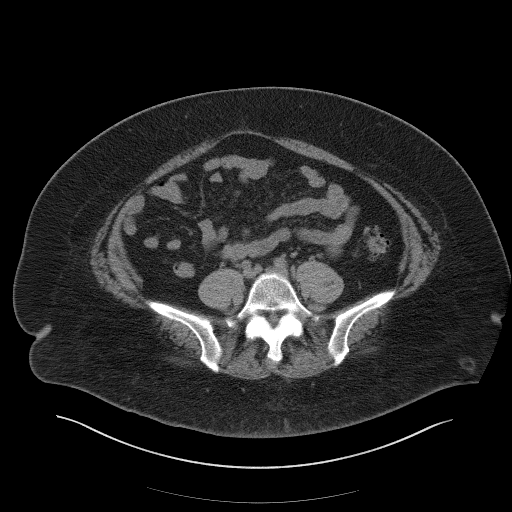
[im 51/94  soft-tissue]
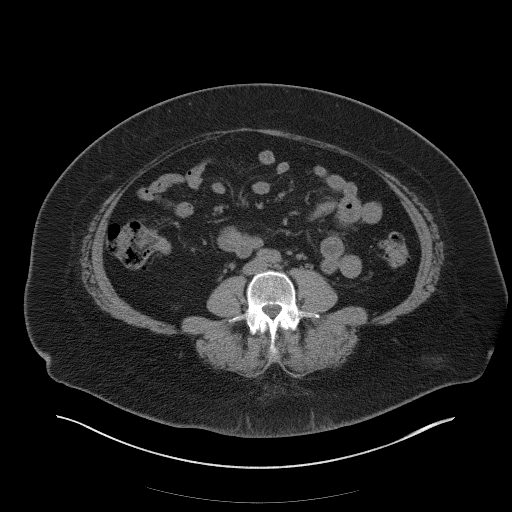
[im 55/94  soft-tissue]
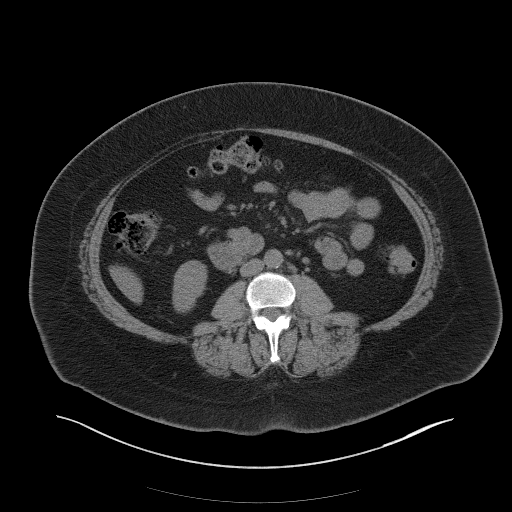
[im 55/94  bone]
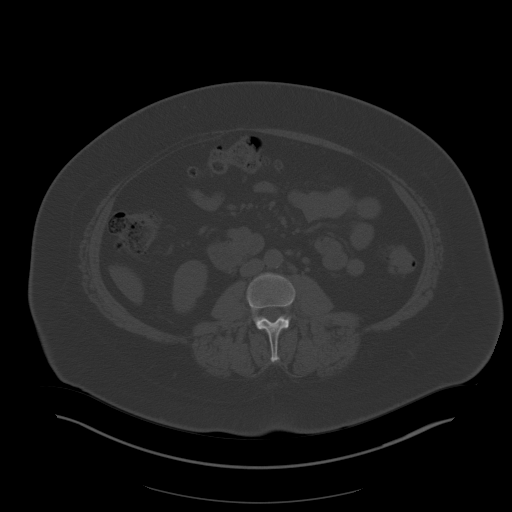
[im 63/94  soft-tissue]
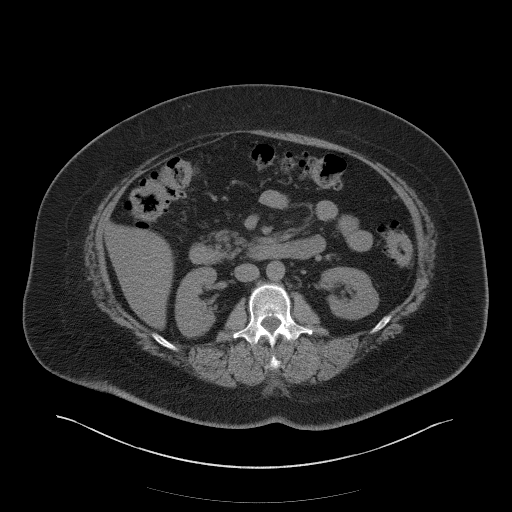
[im 70/94  soft-tissue]
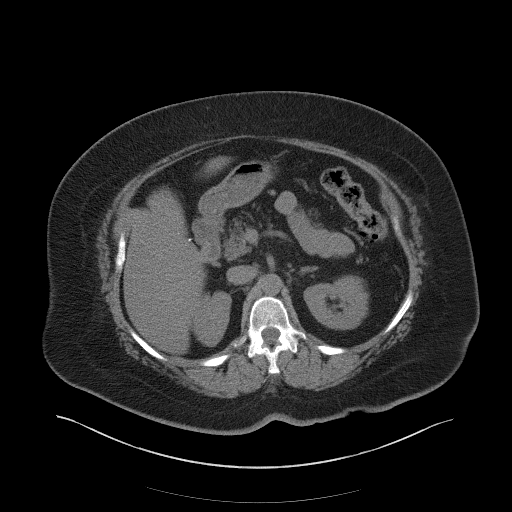
[im 74/94  soft-tissue]
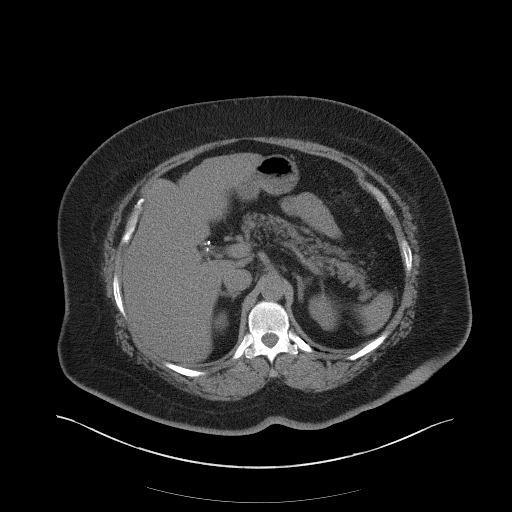
[im 82/94  soft-tissue]
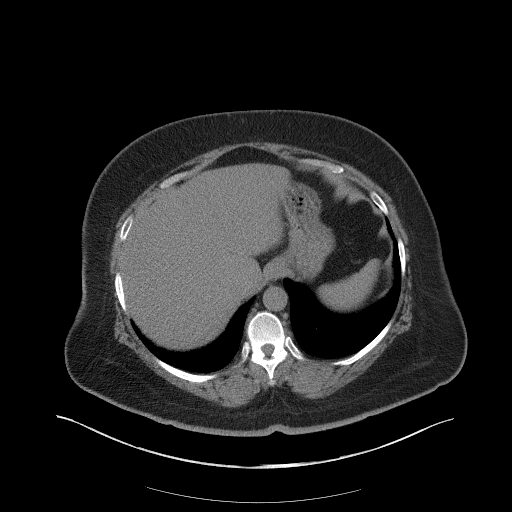
[im 90/94  soft-tissue]
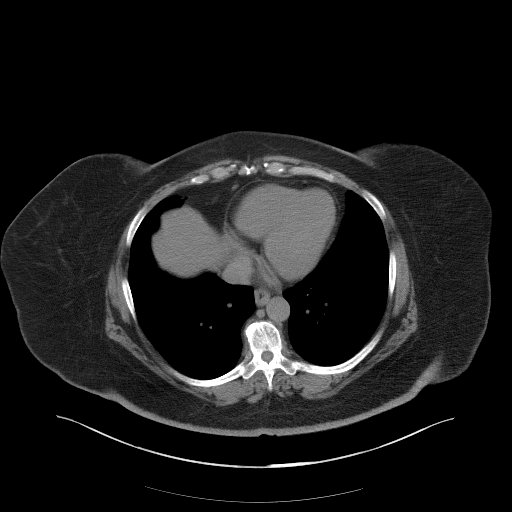

[Series 5: renal stone 3.0 coronal · coronal · 0.86mm/px · 3 of 116 slices shown]
[im 39/116  soft-tissue]
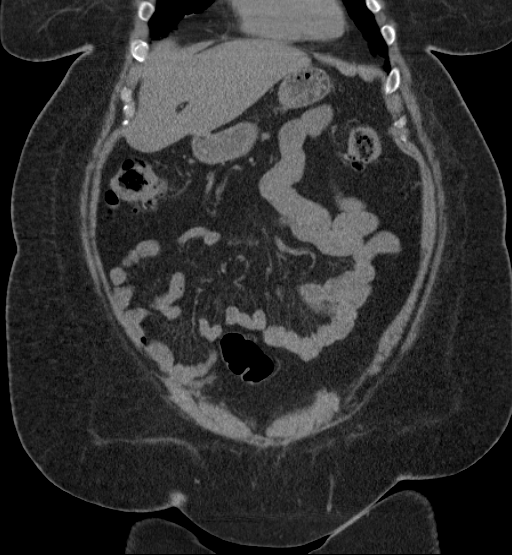
[im 52/116  soft-tissue]
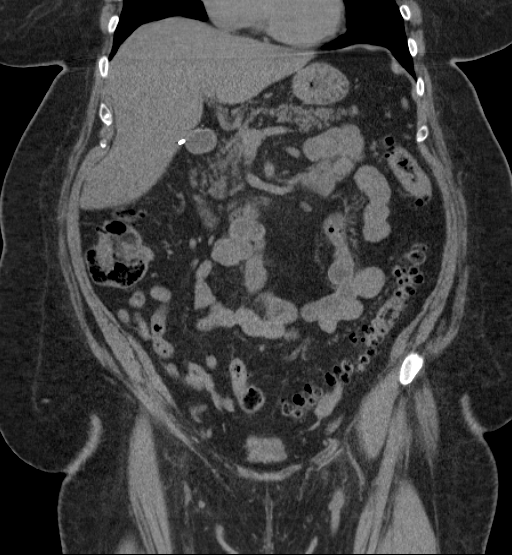
[im 64/116  soft-tissue]
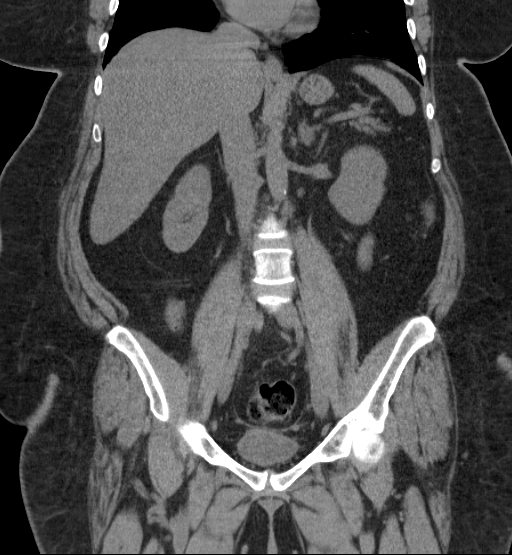

[17 of 46 positions shown; findings below may reference images not displayed]

FINDINGS: Lung bases are clear.  Heart size within normal limits

Liver and spleen are normal. Prior cholecystectomy. Bile ducts
nondilated. Negative for pancreatic mass or edema.

Normal kidneys. No renal mass or obstruction. No renal calculi
identified. Urinary bladder normal.

Negative for bowel obstruction. Pan diverticulosis of the colon. No
evidence of acute diverticulitis.

Negative for mass or adenopathy. No free fluid. No focal skeletal
lesion.
IMPRESSION: Pandiverticulosis without evidence of diverticulitis

No acute abnormality.

## 2016-08-28 ENCOUNTER — Emergency Department (HOSPITAL_BASED_OUTPATIENT_CLINIC_OR_DEPARTMENT_OTHER): Payer: No Typology Code available for payment source

## 2016-08-28 ENCOUNTER — Emergency Department (HOSPITAL_BASED_OUTPATIENT_CLINIC_OR_DEPARTMENT_OTHER)
Admission: EM | Admit: 2016-08-28 | Discharge: 2016-08-28 | Disposition: A | Discharge status: Home / Self Care | Payer: No Typology Code available for payment source | Attending: Emergency Medicine | Admitting: Emergency Medicine

## 2016-08-28 ENCOUNTER — Encounter (HOSPITAL_BASED_OUTPATIENT_CLINIC_OR_DEPARTMENT_OTHER): Payer: Self-pay | Admitting: *Deleted

## 2016-08-28 DIAGNOSIS — I10 Essential (primary) hypertension: Secondary | ICD-10-CM | POA: Insufficient documentation

## 2016-08-28 DIAGNOSIS — Z79899 Other long term (current) drug therapy: Secondary | ICD-10-CM | POA: Insufficient documentation

## 2016-08-28 DIAGNOSIS — Y929 Unspecified place or not applicable: Secondary | ICD-10-CM | POA: Insufficient documentation

## 2016-08-28 DIAGNOSIS — W0110XA Fall on same level from slipping, tripping and stumbling with subsequent striking against unspecified object, initial encounter: Secondary | ICD-10-CM | POA: Diagnosis not present

## 2016-08-28 DIAGNOSIS — T07XXXA Unspecified multiple injuries, initial encounter: Secondary | ICD-10-CM

## 2016-08-28 DIAGNOSIS — W19XXXA Unspecified fall, initial encounter: Secondary | ICD-10-CM

## 2016-08-28 DIAGNOSIS — Z794 Long term (current) use of insulin: Secondary | ICD-10-CM | POA: Diagnosis not present

## 2016-08-28 DIAGNOSIS — S39012A Strain of muscle, fascia and tendon of lower back, initial encounter: Secondary | ICD-10-CM

## 2016-08-28 DIAGNOSIS — Y999 Unspecified external cause status: Secondary | ICD-10-CM | POA: Diagnosis not present

## 2016-08-28 DIAGNOSIS — Y9301 Activity, walking, marching and hiking: Secondary | ICD-10-CM | POA: Insufficient documentation

## 2016-08-28 DIAGNOSIS — E119 Type 2 diabetes mellitus without complications: Secondary | ICD-10-CM | POA: Insufficient documentation

## 2016-08-28 DIAGNOSIS — S29012A Strain of muscle and tendon of back wall of thorax, initial encounter: Secondary | ICD-10-CM | POA: Diagnosis not present

## 2016-08-28 DIAGNOSIS — T148XXA Other injury of unspecified body region, initial encounter: Secondary | ICD-10-CM | POA: Diagnosis not present

## 2016-08-28 DIAGNOSIS — S4991XA Unspecified injury of right shoulder and upper arm, initial encounter: Secondary | ICD-10-CM | POA: Diagnosis present

## 2016-08-28 DIAGNOSIS — Z7984 Long term (current) use of oral hypoglycemic drugs: Secondary | ICD-10-CM | POA: Diagnosis not present

## 2016-08-28 MED ORDER — OXYCODONE-ACETAMINOPHEN 5-325 MG PO TABS: 2.0000 | ORAL_TABLET | Freq: Once | ORAL | Status: DC

## 2016-08-28 NOTE — ED Triage Notes (Signed)
Pt c/o fall x 6 hrs ago , c/o right knee and shoulder pain

## 2016-08-28 NOTE — ED Provider Notes (Signed)
[facility] Provider Note   CSN: [id number] Arrival date & time: 08/28/16  1714   By signing my name below, I, Cindy Ashley, attest that this documentation has been prepared under the direction and in the presence of Rolan BuccoBelfi, Mercy Leppla, MD. Electronically Signed: Cynda AcresHailei Ashley, Scribe. 08/28/16. 5:59 PM.   History   Chief Complaint Chief Complaint  Patient presents with  . Fall    HPI Comments: Cindy Ashley is a 66 y.o. female with a history of hypertension and diabetes mellitus, who presents to the Emergency Department complaining of sudden-onset, constant right knee pain s/p mechanical fall that occurred this morning at 11:30 AM. Patient states she was walking into a gas station when she tripped on the ledge of the sidewalk and landed on her right knee and shoulder. Patient denies any head injury or loss of consciousness. Patient reports tingling to right thigh and low back pain (chronic for her but worsen after fall). No medications taken prior to arrival. Patient denies any fever, chills, numbness, weakness, or any additional symptoms.   The history is provided by the patient. No language interpreter was used.    Past Medical History:  Diagnosis Date  . Diabetes mellitus without complication (HCC)   . Hypertension   . Sleep apnea   . Vitamin D deficiency     There are no active problems to display for this patient.   Past Surgical History:  Procedure Laterality Date  . ABDOMINAL HYSTERECTOMY    . carpel tunnel    . CHOLECYSTECTOMY    . KNEE SURGERY      OB History    No data available       Home Medications    Prior to Admission medications   Medication Sig Start Date End Date Taking? Authorizing Provider  Acyclovir (ZOVIRAX PO) Take by mouth.    [provider]  cyclobenzaprine (FLEXERIL) 10 MG tablet Take 1 tablet (10 mg total) by mouth 2 (two) times daily as needed for muscle spasms. 07/28/14   Purvis SheffieldHarrison, Forrest, MD  Cyclobenzaprine HCl  (FLEXERIL PO) Take by mouth.    [provider]  Hydrocodone-Acetaminophen (VICODIN PO) Take by mouth.    [provider]  insulin glargine (LANTUS) 100 UNIT/ML injection Inject into the skin at bedtime.    [provider]  LISINOPRIL PO Take by mouth.    [provider]  oxyCODONE (ROXICODONE) 5 MG immediate release tablet Take 1-2 tablets every 4-6 hours as needed for pain. 07/28/14   Purvis SheffieldHarrison, Forrest, MD  oxyCODONE-acetaminophen (PERCOCET/ROXICET) 5-325 MG per tablet Take 1-2 tablets by mouth every 4 (four) hours as needed for pain. 04/23/12   Teressa LowerPickering, Vrinda, NP  Pitavastatin Calcium (LIVALO PO) Take by mouth.    [provider]  SitaGLIPtin-MetFORMIN HCl (JANUMET PO) Take by mouth.    [provider]    Family History No family history on file.  Social History Social History  Substance Use Topics  . Smoking status: Never Smoker  . Smokeless tobacco: Not on file  . Alcohol use Yes     Allergies   Penicillins   Review of Systems Review of Systems   Physical Exam Updated Vital Signs BP (!) 145/89 (BP Location: Right Arm)   Pulse 79   Temp 98.3 F (36.8 C)   Resp 18   Ht 5\' 7"  (1.702 m)   Wt 114.3 kg (252 lb)   SpO2 99%   BMI 39.47 kg/m   Physical Exam  Constitutional: She is  oriented to person, place, and time. She appears well-developed and well-nourished.  HENT:  Head: Normocephalic and atraumatic.  Eyes: EOM are normal. Pupils are equal, round, and reactive to light.  Neck: Normal range of motion. Neck supple.  Cardiovascular: Normal rate and regular rhythm.   Pulmonary/Chest: Effort normal and breath sounds normal.  Musculoskeletal: Normal range of motion. She exhibits tenderness. She exhibits no edema or deformity.  Pain to the anterior portion of the right shoulder. No pain to the elbow or wrist. She has normal sensation and motor function in the arm. Radial pulses are intact. There is tenderness to  palpation and limited range of motion of the right hip, right knee, and right ankle. Normal sensation and motor function in the left leg. Pedal pulses intact. No pain in the cervical or thoracic spine. There is tenderness along the lumbosacral spine and along the lumbar musculature bilaterally. Negative straight leg raise bilaterally. No pain on range of motion of the left extremities.    Neurological: She is alert and oriented to person, place, and time.  Skin: Skin is warm and dry.  Psychiatric: She has a normal mood and affect.  Nursing note and vitals reviewed.    ED Treatments / Results  DIAGNOSTIC STUDIES: Oxygen Saturation is 99% on RA, normal by my interpretation.    COORDINATION OF CARE: 5:54 PM Discussed treatment plan with pt at bedside and pt agreed to plan.   Labs (all labs ordered are listed, but only abnormal results are displayed) Labs Reviewed - No data to display  EKG  EKG Interpretation None       Radiology Dg Lumbar Spine Complete  Result Date: 08/28/2016 CLINICAL DATA:  Fall with back pain radiating to the right hip EXAM: LUMBAR SPINE - COMPLETE 4+ VIEW COMPARISON:  CT 06/10/2014 FINDINGS: Surgical clips in the right upper quadrant. Five non rib-bearing lumbar type vertebra. SI joints are symmetric. Calcified phleboliths in the right pelvis. Lumbar alignment is within normal limits. Vertebral body heights are maintained. Mild degenerative changes at L3-L4 and L4-L5. IMPRESSION: 1. Mild degenerative changes.  No acute osseous abnormality. Electronically Signed   By: [name] M.D.   On: 08/28/2016 18:51   Dg Shoulder Right  Result Date: 08/28/2016 CLINICAL DATA:  66 y/o F; status post fall with right shoulder pain. EXAM: RIGHT SHOULDER - 2+ VIEW COMPARISON:  None. FINDINGS: There is no evidence of fracture or dislocation. There is no evidence of arthropathy or other focal bone abnormality. Soft tissues are unremarkable. IMPRESSION: No acute fracture or  dislocation identified. Electronically Signed   By: [name] M.D.   On: 08/28/2016 18:46   Dg Ankle Complete Right  Result Date: 08/28/2016 CLINICAL DATA:  Ankle pain and swelling, fall. EXAM: RIGHT ANKLE - COMPLETE 3+ VIEW COMPARISON:  None. FINDINGS: No fracture deformity nor dislocation. The ankle mortise appears congruent and the tibiofibular syndesmosis intact. Moderate plantar calcaneal spur. Mild midfoot osteoarthrosis. No destructive bony lesions. Diffuse ankle soft tissue swelling without subcutaneous gas or radiopaque foreign bodies. IMPRESSION: Soft tissue swelling without acute osseous process. Electronically Signed   By: Awilda Metro M.D.   On: 08/28/2016 18:50   Dg Knee Complete 4 Views Right  Result Date: 08/28/2016 CLINICAL DATA:  66 y/o  F; status post fall with pain. EXAM: RIGHT KNEE - COMPLETE 4+ VIEW COMPARISON:  None. FINDINGS: No acute fracture or dislocation identified. No joint effusion. Mild medial femorotibial compartment joint space narrowing. Severe productive changes of the patellofemoral compartment  and mild femorotibial compartment osteophytosis. Spurring of tibial spines. IMPRESSION: 1. No acute fracture or dislocation.  No joint effusion. 2. 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 Electronically Signed   By: [name] M.D.   On: 08/28/2016 18:47   Dg Hip Unilat W Or Wo Pelvis 2-3 Views Right  Result Date: 08/28/2016 CLINICAL DATA:  66 y/o F; right hip pain radiating into the right buttocks after a fall. EXAM: DG HIP (WITH OR WITHOUT PELVIS) 2-3V RIGHT COMPARISON:  None. FINDINGS: There is no evidence of hip fracture or dislocation. There is no evidence of arthropathy or other focal bone abnormality. IMPRESSION: No acute fracture or dislocation identified. Electronically Signed   By: [name] M.D.   On: 08/28/2016 18:53    Procedures Procedures (including  critical care time)  Medications Ordered in ED Medications - No data to display   Initial Impression / Assessment and Plan / ED Course  I have reviewed the triage vital signs and the nursing notes.  Pertinent labs & imaging results that were available during my care of the patient were reviewed by me and considered in my medical decision making (see chart for details).     Patient presents after mechanical fall. There is no evidence of fractures. She has some low back pain that is neurologically intact. She is able to ambulate unassisted. She was discharged home in good condition. She is on chronic oxycodone. She will take this at home for her pain management. She was encouraged to follow-up with her PCP. Return precautions were given.  Final Clinical Impressions(s) / ED Diagnoses   Final diagnoses:  Fall, initial encounter  Back strain, initial encounter  Multiple contusions    New Prescriptions Discharge Medication List as of 08/28/2016  7:11 PM     I personally performed the services described in this documentation, which was scribed in my presence.  The recorded information has been reviewed and considered.     Rolan Bucco, MD 08/28/16 [phone]

## 2017-09-27 ENCOUNTER — Encounter

## 2017-09-27 ENCOUNTER — Ambulatory Visit (INDEPENDENT_AMBULATORY_CARE_PROVIDER_SITE_OTHER): Payer: Medicare Other | Admitting: Diagnostic Neuroimaging

## 2017-09-27 ENCOUNTER — Encounter (INDEPENDENT_AMBULATORY_CARE_PROVIDER_SITE_OTHER): Payer: Self-pay | Admitting: Diagnostic Neuroimaging

## 2017-09-27 DIAGNOSIS — Z0289 Encounter for other administrative examinations: Secondary | ICD-10-CM

## 2017-09-27 DIAGNOSIS — R200 Anesthesia of skin: Secondary | ICD-10-CM

## 2017-09-28 NOTE — Procedures (Signed)
[facility]  NCS (NERVE CONDUCTION STUDY) WITH EMG (ELECTROMYOGRAPHY) REPORT   STUDY DATE: 09/27/17 PATIENT NAME: Cindy Ashley DOB: 24-Jan-1951 MRN: [id number]  ORDERING CLINICIAN: Emmaline LifeAnthony Wheeler, MD  TECHNOLOGIST: Charlesetta IvoryBeau Handy ELECTROMYOGRAPHER: Glenford BayleyVikram R. Penumalli, MD  CLINICAL INFORMATION: 67 year old female here for evaluation of left hand numbness.  Patient fell down Feb 2018 and then developed left hand numbness.  Patient has history of bilateral carpal tunnel release surgery.  FINDINGS: NERVE CONDUCTION STUDY:  Left median motor response is prolonged distal latency, normal energy, normal conduction velocity.  Right median and left ulnar motor responses normal.  Left radial, right median and right ulnar sensory responses normal.  Left median sensory response has prolonged peak latency and decreased amplitude.  Left ulnar sensory responses normal peak latency and slightly decreased amplitude.   NEEDLE ELECTROMYOGRAPHY:  Needle examination of left upper extremity is normal.   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    INTERPRETING PHYSICIAN:  Suanne MarkerVIKRAM R. PENUMALLI, MD Certified in Neurology, Neurophysiology and Neuroimaging  [facility] [address], [address] [place], [place] [address] [phone]   [facility]    Nerve / Sites Muscle Latency Ref. Amplitude Ref. Rel Amp Segments Distance Velocity Ref. Area    ms ms mV mV %  cm m/s m/s mVms  L Median - APB     Wrist APB 5.6 ?4.4 4.6 ?4.0 100 Wrist - APB 7   15.3     Upper arm APB 10.3  4.2  92.1 Upper arm - Wrist 23 50 ?49 13.7  R Median - APB     Wrist APB 3.2 ?4.4 6.5 ?4.0 100 Wrist - APB 7   20.1     Upper arm APB 7.0  6.2  96.4 Upper arm - Wrist 22 58 ?49 20.0  L Ulnar - ADM     Wrist ADM 3.1 ?3.3 7.5 ?6.0 100 Wrist -  ADM 7   24.5     B.Elbow ADM 7.2  6.8  90 B.Elbow - Wrist 21 52 ?49 23.0     A.Elbow ADM 9.4  6.6  98 A.Elbow - B.Elbow 12 54 ?49 22.5         A.Elbow - Wrist               SNC    Nerve / Sites Rec. Site Peak Lat Ref.  Amp Ref. Segments Distance    ms ms V V  cm  L Radial - Anatomical snuff box (Forearm)     Forearm Wrist 2.7 ?2.9 15 ?15 Forearm - Wrist 10  L Median - Orthodromic (Dig II, Mid palm)     Dig II Wrist 5.7 ?3.4 2 ?10 Dig II - Wrist 13  R Median - Orthodromic (Dig II, Mid palm)     Dig II Wrist 3.1 ?3.4 11 ?10 Dig II - Wrist 13  L Ulnar - Orthodromic, (Dig V, Mid palm)     Dig V Wrist 2.8 ?3.1 4 ?5 Dig V - Wrist 11  R Ulnar - Orthodromic, (Dig V, Mid palm)     Dig V Wrist 2.5 ?3.1 5 ?5 Dig V - Wrist 911                 F  Wave    Nerve F Lat Ref.   ms ms  L Ulnar -  ADM 29.4 ?32.0       EMG full       EMG Summary Table    Spontaneous MUAP Recruitment  Muscle IA Fib PSW Fasc Other Amp Dur. Poly Pattern  L. Deltoid Normal None None None _______ Normal Normal Normal Normal  L. Biceps brachii Normal None None None _______ Normal Normal Normal Normal  L. Triceps brachii Normal None None None _______ Normal Normal Normal Normal  L. Flexor carpi radialis Normal None None None _______ Normal Normal Normal Normal  L. First dorsal interosseous Normal None None None _______ Normal Normal Normal Normal

## 2019-12-17 ENCOUNTER — Other Ambulatory Visit: Payer: Self-pay

## 2019-12-17 ENCOUNTER — Emergency Department (HOSPITAL_BASED_OUTPATIENT_CLINIC_OR_DEPARTMENT_OTHER)
Admission: EM | Admit: 2019-12-17 | Discharge: 2019-12-17 | Disposition: A | Discharge status: Home / Self Care | Payer: Medicare Other | Attending: Emergency Medicine | Admitting: Emergency Medicine

## 2019-12-17 ENCOUNTER — Emergency Department (HOSPITAL_BASED_OUTPATIENT_CLINIC_OR_DEPARTMENT_OTHER): Payer: Medicare Other

## 2019-12-17 ENCOUNTER — Encounter (HOSPITAL_BASED_OUTPATIENT_CLINIC_OR_DEPARTMENT_OTHER): Payer: Self-pay

## 2019-12-17 DIAGNOSIS — R0789 Other chest pain: Secondary | ICD-10-CM | POA: Diagnosis not present

## 2019-12-17 DIAGNOSIS — Z794 Long term (current) use of insulin: Secondary | ICD-10-CM | POA: Diagnosis not present

## 2019-12-17 DIAGNOSIS — Z20822 Contact with and (suspected) exposure to covid-19: Secondary | ICD-10-CM | POA: Insufficient documentation

## 2019-12-17 DIAGNOSIS — I10 Essential (primary) hypertension: Secondary | ICD-10-CM | POA: Insufficient documentation

## 2019-12-17 DIAGNOSIS — Z79899 Other long term (current) drug therapy: Secondary | ICD-10-CM | POA: Insufficient documentation

## 2019-12-17 DIAGNOSIS — R059 Cough, unspecified: Secondary | ICD-10-CM

## 2019-12-17 DIAGNOSIS — E119 Type 2 diabetes mellitus without complications: Secondary | ICD-10-CM | POA: Insufficient documentation

## 2019-12-17 DIAGNOSIS — R079 Chest pain, unspecified: Secondary | ICD-10-CM | POA: Diagnosis present

## 2019-12-17 DIAGNOSIS — R0982 Postnasal drip: Secondary | ICD-10-CM | POA: Insufficient documentation

## 2019-12-17 HISTORY — DX: Pure hypercholesterolemia, unspecified: E78.00

## 2019-12-17 LAB — TROPONIN I (HIGH SENSITIVITY)
Troponin I (High Sensitivity): <2 ng/L (ref <18)
Troponin I (High Sensitivity): <2 ng/L (ref <18)

## 2019-12-17 LAB — CBC
HCT: 39.4 % (ref 36.0–46.0)
Hemoglobin: 12.3 g/dL (ref 12.0–15.0)
MCH: 27.3 pg (ref 26.0–34.0)
MCHC: 31.2 g/dL (ref 30.0–36.0)
MCV: 87.6 fL (ref 80.0–100.0)
Platelets: 227 10*3/uL (ref 150–400)
RBC: 4.5 MIL/uL (ref 3.87–5.11)
RDW: 14 % (ref 11.5–15.5)
WBC: 4.9 10*3/uL (ref 4.0–10.5)
nRBC: 0 % (ref 0.0–0.2)

## 2019-12-17 LAB — COMPREHENSIVE METABOLIC PANEL
ALT: 15 U/L (ref 0–44)
AST: 19 U/L (ref 15–41)
Albumin: 3.5 g/dL (ref 3.5–5.0)
Alkaline Phosphatase: 63 U/L (ref 38–126)
Anion gap: 9 (ref 5–15)
BUN: 16 mg/dL (ref 8–23)
CO2: 24 mmol/L (ref 22–32)
Calcium: 8.6 mg/dL — ABNORMAL LOW (ref 8.9–10.3)
Chloride: 105 mmol/L (ref 98–111)
Creatinine, Ser: 0.94 mg/dL (ref 0.44–1.00)
GFR, Estimated: >60 mL/min (ref >60)
Glucose, Bld: 179 mg/dL — ABNORMAL HIGH (ref 70–99)
Potassium: 3.3 mmol/L — ABNORMAL LOW (ref 3.5–5.1)
Sodium: 138 mmol/L (ref 135–145)
Total Bilirubin: 0.3 mg/dL (ref 0.3–1.2)
Total Protein: 7 g/dL (ref 6.5–8.1)

## 2019-12-17 LAB — RESPIRATORY PANEL BY RT PCR (FLU A&B, COVID)
Influenza A by PCR: NEGATIVE
Influenza B by PCR: NEGATIVE
SARS Coronavirus 2 by RT PCR: NEGATIVE

## 2019-12-17 NOTE — ED Notes (Signed)
Discharge instructions discussed with patient. Follow up care understood. Departs ED at this time in stable condition.

## 2019-12-17 NOTE — ED Provider Notes (Signed)
[facility] EMERGENCY DEPARTMENT Provider Note   CSN: [id number] Arrival date & time: 12/17/19  1706     History Chief Complaint  Patient presents with  . Chest Pain    Cindy Ashley is a 69 y.o. female.  69yo F w/ PMH including HTN, HLD, OSA, T2DM who p/w chest pain and cough. Pt reports that 1.5 weeks ago, she began having central to right sided chest soreness that has been constant since it began, non-exertional, and not associated w/ SOB, diaphoresis, or N/v. She notes that she has a rotator cuff injury on her right shoulder and has chronic pain in this shoulder. She states that sometimes the pain in her shoulder moves down into her chest and up the side of her neck. Over the past day, she has developed a mild cough and some postnasal drip. No fevers, vomiting, diarrhea, or sick contacts. No hx heart problems. No recent travel, leg swelling/pain, h/o blood clots, or h/o cancer. No estrogen use.  The history is provided by the patient.  Chest Pain      Past Medical History:  Diagnosis Date  . Diabetes mellitus without complication (HCC)   . High cholesterol   . Hypertension   . Sleep apnea   . Vitamin D deficiency     There are no problems to display for this patient.   Past Surgical History:  Procedure Laterality Date  . ABDOMINAL HYSTERECTOMY    . carpel tunnel    . CHOLECYSTECTOMY    . KNEE SURGERY       OB History   No obstetric history on file.     No family history on file.  Social History   Tobacco Use  . Smoking status: Never Smoker  . Smokeless tobacco: Never Used  Vaping Use  . Vaping Use: Never used  Substance Use Topics  . Alcohol use: Yes    Comment: occ  . Drug use: No    Home Medications Prior to Admission medications   Medication Sig Start Date End Date Taking? Authorizing Provider  Acyclovir (ZOVIRAX PO) Take by mouth.    [provider]  cyclobenzaprine (FLEXERIL) 10 MG tablet Take 1 tablet (10 mg total) by  mouth 2 (two) times daily as needed for muscle spasms. 07/28/14   Purvis Sheffield, MD  Cyclobenzaprine HCl (FLEXERIL PO) Take by mouth.    [provider]  Hydrocodone-Acetaminophen (VICODIN PO) Take by mouth.    [provider]  insulin glargine (LANTUS) 100 UNIT/ML injection Inject into the skin at bedtime.    [provider]  LISINOPRIL PO Take by mouth.    [provider]  oxyCODONE (ROXICODONE) 5 MG immediate release tablet Take 1-2 tablets every 4-6 hours as needed for pain. 07/28/14   Purvis Sheffield, MD  oxyCODONE-acetaminophen (PERCOCET/ROXICET) 5-325 MG per tablet Take 1-2 tablets by mouth every 4 (four) hours as needed for pain. 04/23/12   Teressa Lower, NP  Pitavastatin Calcium (LIVALO PO) Take by mouth.    [provider]  SitaGLIPtin-MetFORMIN HCl (JANUMET PO) Take by mouth.    [provider]    Allergies    Penicillins  Review of Systems   Review of Systems  Cardiovascular: Positive for chest pain.   All other systems reviewed and are negative except that which was mentioned in HPI  Physical Exam Updated Vital Signs BP (!) 150/78 (BP Location: Right Arm)   Pulse 87   Temp 98.2 F (36.8 C) (Oral)   Resp 18  Ht 5\' 7"  (1.702 m)   Wt 117.5 kg   SpO2 96%   BMI 40.57 kg/m   Physical Exam Vitals and nursing note reviewed.  Constitutional:      General: She is not in acute distress.    Appearance: She is well-developed.  HENT:     Head: Normocephalic and atraumatic.  Eyes:     Conjunctiva/sclera: Conjunctivae normal.  Cardiovascular:     Rate and Rhythm: Normal rate and regular rhythm.     Heart sounds: Normal heart sounds. No murmur heard.   Pulmonary:     Effort: Pulmonary effort is normal.     Breath sounds: Normal breath sounds.  Abdominal:     General: Bowel sounds are normal. There is no distension.     Palpations: Abdomen is soft.     Tenderness: There is no abdominal tenderness.    Musculoskeletal:     Cervical back: Neck supple.     Right lower leg: No edema.     Left lower leg: No edema.  Skin:    General: Skin is warm and dry.  Neurological:     Mental Status: She is alert and oriented to person, place, and time.     Comments: Fluent speech  Psychiatric:        Mood and Affect: Mood normal.        Judgment: Judgment normal.     ED Results / Procedures / Treatments   Labs (all labs ordered are listed, but only abnormal results are displayed) Labs Reviewed  COMPREHENSIVE METABOLIC PANEL - Abnormal; Notable for the following components:      Result Value   Potassium 3.3 (*)    Glucose, Bld 179 (*)    Calcium 8.6 (*)    All other components within normal limits  RESPIRATORY PANEL BY RT PCR (FLU A&B, COVID)  CBC  TROPONIN I (HIGH SENSITIVITY)  TROPONIN I (HIGH SENSITIVITY)    EKG EKG Interpretation  Date/Time:  Wednesday December 17 2019 17:32:18 EDT Ventricular Rate:  98 PR Interval:    QRS Duration: 87 QT Interval:  351 QTC Calculation: 449 R Axis:   5 Text Interpretation: Sinus rhythm Left ventricular hypertrophy Borderline T abnormalities, diffuse leads Baseline wander in lead(s) V5 V6 similar to previous Confirmed by [id number] [phone]) on 12/17/2019 5:44:56 PM   Radiology DG Chest Portable 1 View  Result Date: 12/17/2019 CLINICAL DATA:  Chest pain cough EXAM: PORTABLE CHEST 1 VIEW COMPARISON:  08/26/2015 FINDINGS: No focal opacity or pleural effusion. Cardiomediastinal silhouette within normal limits. No pneumothorax. IMPRESSION: No active disease. Electronically Signed   By: 10/27/2015 M.D.   On: 12/17/2019 18:25    Procedures Procedures (including critical care time)  Medications Ordered in ED Medications - No data to display  ED Course  I have reviewed the triage vital signs and the nursing notes.  Pertinent labs & imaging results that were available during my care of the patient were reviewed by me and considered in my  medical decision making (see chart for details).    MDM Rules/Calculators/A&P                          Well appearing and comfortable on exam, reassuring VS. EKG without ischemic changes. CXR clear. COVID negative. No risk factors for PE And sx sound very atypical for PE. Serial trops negative. Her description of 1 week of constant, non-exertional chest soreness sounds very atypical for ACS and  I am reassured by her negative w/u here. Recommended close PCP f/u and extensively reviewed return precautions. Final Clinical Impression(s) / ED Diagnoses Final diagnoses:  Atypical chest pain  Cough    Rx / DC Orders ED Discharge Orders    None       Hilliard Borges, Ambrose Finland, MD 12/17/19 2348

## 2019-12-17 NOTE — ED Triage Notes (Signed)
Pt c/o CP "soreness" x 1.5 weeks-cough and nasal drainage x today-NAD-steady gait

## 2021-08-06 ENCOUNTER — Other Ambulatory Visit: Payer: Self-pay

## 2021-08-06 ENCOUNTER — Emergency Department (HOSPITAL_BASED_OUTPATIENT_CLINIC_OR_DEPARTMENT_OTHER): Payer: Medicare Other

## 2021-08-06 ENCOUNTER — Encounter (HOSPITAL_BASED_OUTPATIENT_CLINIC_OR_DEPARTMENT_OTHER): Payer: Self-pay | Admitting: Emergency Medicine

## 2021-08-06 ENCOUNTER — Emergency Department (HOSPITAL_BASED_OUTPATIENT_CLINIC_OR_DEPARTMENT_OTHER)
Admission: EM | Admit: 2021-08-06 | Discharge: 2021-08-06 | Disposition: A | Discharge status: Home / Self Care | Payer: Medicare Other | Attending: Emergency Medicine | Admitting: Emergency Medicine

## 2021-08-06 DIAGNOSIS — Z794 Long term (current) use of insulin: Secondary | ICD-10-CM | POA: Diagnosis not present

## 2021-08-06 DIAGNOSIS — Z7984 Long term (current) use of oral hypoglycemic drugs: Secondary | ICD-10-CM | POA: Insufficient documentation

## 2021-08-06 DIAGNOSIS — R0789 Other chest pain: Secondary | ICD-10-CM | POA: Insufficient documentation

## 2021-08-06 DIAGNOSIS — R079 Chest pain, unspecified: Secondary | ICD-10-CM

## 2021-08-06 DIAGNOSIS — I10 Essential (primary) hypertension: Secondary | ICD-10-CM | POA: Insufficient documentation

## 2021-08-06 DIAGNOSIS — Z79899 Other long term (current) drug therapy: Secondary | ICD-10-CM | POA: Insufficient documentation

## 2021-08-06 DIAGNOSIS — E119 Type 2 diabetes mellitus without complications: Secondary | ICD-10-CM | POA: Diagnosis not present

## 2021-08-06 LAB — COMPREHENSIVE METABOLIC PANEL
ALT: 14 U/L (ref 0–44)
AST: 14 U/L — ABNORMAL LOW (ref 15–41)
Albumin: 3.4 g/dL — ABNORMAL LOW (ref 3.5–5.0)
Alkaline Phosphatase: 65 U/L (ref 38–126)
Anion gap: 5 (ref 5–15)
BUN: 21 mg/dL (ref 8–23)
CO2: 24 mmol/L (ref 22–32)
Calcium: 9.1 mg/dL (ref 8.9–10.3)
Chloride: 109 mmol/L (ref 98–111)
Creatinine, Ser: 1.03 mg/dL — ABNORMAL HIGH (ref 0.44–1.00)
GFR, Estimated: 58 mL/min — ABNORMAL LOW (ref >60)
Glucose, Bld: 134 mg/dL — ABNORMAL HIGH (ref 70–99)
Potassium: 3.7 mmol/L (ref 3.5–5.1)
Sodium: 138 mmol/L (ref 135–145)
Total Bilirubin: 0.6 mg/dL (ref 0.3–1.2)
Total Protein: 7.2 g/dL (ref 6.5–8.1)

## 2021-08-06 LAB — CBC WITH DIFFERENTIAL/PLATELET
Abs Immature Granulocytes: 0.04 10*3/uL (ref 0.00–0.07)
Basophils Absolute: 0.1 10*3/uL (ref 0.0–0.1)
Basophils Relative: 1 %
Eosinophils Absolute: 0.1 10*3/uL (ref 0.0–0.5)
Eosinophils Relative: 1 %
HCT: 42.9 % (ref 36.0–46.0)
Hemoglobin: 13.8 g/dL (ref 12.0–15.0)
Immature Granulocytes: 1 %
Lymphocytes Relative: 21 %
Lymphs Abs: 1.8 10*3/uL (ref 0.7–4.0)
MCH: 27.8 pg (ref 26.0–34.0)
MCHC: 32.2 g/dL (ref 30.0–36.0)
MCV: 86.5 fL (ref 80.0–100.0)
Monocytes Absolute: 0.7 10*3/uL (ref 0.1–1.0)
Monocytes Relative: 8 %
Neutro Abs: 5.7 10*3/uL (ref 1.7–7.7)
Neutrophils Relative %: 68 %
Platelets: 286 10*3/uL (ref 150–400)
RBC: 4.96 MIL/uL (ref 3.87–5.11)
RDW: 13.9 % (ref 11.5–15.5)
WBC: 8.4 10*3/uL (ref 4.0–10.5)
nRBC: 0 % (ref 0.0–0.2)

## 2021-08-06 LAB — TROPONIN I (HIGH SENSITIVITY): Troponin I (High Sensitivity): 2 ng/L (ref <18)

## 2021-08-06 MED ORDER — CYCLOBENZAPRINE HCL 10 MG PO TABS: 10.0000 mg | ORAL_TABLET | Freq: Three times a day (TID) | ORAL | 0 refills | Status: AC | PRN

## 2021-08-06 MED ORDER — CYCLOBENZAPRINE HCL 10 MG PO TABS: 10.0000 mg | ORAL_TABLET | Freq: Three times a day (TID) | ORAL | 0 refills | Status: DC | PRN

## 2021-08-06 NOTE — ED Notes (Signed)
Pt transported to radiology.

## 2021-08-06 NOTE — ED Triage Notes (Signed)
Pt states she has been having pain in the right side of her chest since Thursday  Pt states the pain is worse with movement, coughing and sneezing  Pt describes it as a constant soreness

## 2021-08-06 NOTE — ED Provider Notes (Signed)
[facility] EMERGENCY DEPARTMENT Provider Note   CSN: [id number] Arrival date & time: 08/06/21  0641     History  Chief Complaint  Patient presents with   Chest Pain    Cindy Ashley is a 71 y.o. female.  HPI     71 year old female with a history of diabetes, hypertension, hyperlipidemia, OSA, presents with concern for right-sided chest pain.  Reports that she had a fall 6 months ago with shoulder pain which had flared up again recently and had an injection done through the orthopedic doctor, and she feels that the injection has worn off and now the pain that was in her shoulder has radiated into her chest.  Reports the pain was constant, Thursday and Friday and since it was still present this morning came in to be evaluated.  The pain is sharp and worse with making small movements which makes the worse, and is also worse with coughing, sneezing and deep breaths.  Denies any trauma, increased use.  Reports that she has had some occasional sneezing due to allergies.  Denies any significant cough.  Denies fever, shortness of breath, leg pain or swelling, abdominal pain, nausea, vomiting.  Reports she started [facility] and had some constipation and has been taking Dulcolax and has had softer stool since then but no other acute diarrhea.  The pain is also worse with arm movements.  Past Medical History:  Diagnosis Date   Diabetes mellitus without complication (HCC)    High cholesterol    Hypertension    Sleep apnea    Vitamin D deficiency     Home Medications Prior to Admission medications   Medication Sig Start Date End Date Taking? Authorizing Provider  Acyclovir (ZOVIRAX PO) Take by mouth.    [provider]  cyclobenzaprine (FLEXERIL) 10 MG tablet Take 1 tablet (10 mg total) by mouth 3 (three) times daily as needed for muscle spasms. 08/06/21   Alvira Monday, MD  Hydrocodone-Acetaminophen (VICODIN PO) Take by mouth.    [provider]  insulin  glargine (LANTUS) 100 UNIT/ML injection Inject into the skin at bedtime.    [provider]  LISINOPRIL PO Take by mouth.    [provider]  oxyCODONE (ROXICODONE) 5 MG immediate release tablet Take 1-2 tablets every 4-6 hours as needed for pain. 07/28/14   Purvis Sheffield, MD  oxyCODONE-acetaminophen (PERCOCET/ROXICET) 5-325 MG per tablet Take 1-2 tablets by mouth every 4 (four) hours as needed for pain. 04/23/12   Teressa Lower, NP  Pitavastatin Calcium (LIVALO PO) Take by mouth.    [provider]  SitaGLIPtin-MetFORMIN HCl (JANUMET PO) Take by mouth.    [provider]      Allergies    Penicillins    Review of Systems   Review of Systems  Physical Exam Updated Vital Signs BP 136/68   Pulse 77   Temp 98.1 F (36.7 C) (Oral)   Resp 15   Ht 5\' 7"  (1.702 m)   Wt 119.3 kg   SpO2 97%   BMI 41.19 kg/m  Physical Exam Vitals and nursing note reviewed.  Constitutional:      General: She is not in acute distress.    Appearance: She is well-developed. She is not diaphoretic.  HENT:     Head: Normocephalic and atraumatic.  Eyes:     Conjunctiva/sclera: Conjunctivae normal.  Cardiovascular:     Rate and Rhythm: Normal rate and regular rhythm.     Heart sounds: Normal heart sounds. No murmur  heard.    No friction rub. No gallop.  Pulmonary:     Effort: Pulmonary effort is normal. No respiratory distress.     Breath sounds: Normal breath sounds. No wheezing or rales.  Chest:     Chest wall: Tenderness present.  Abdominal:     General: There is no distension.     Palpations: Abdomen is soft.     Tenderness: There is no abdominal tenderness. There is no guarding.  Musculoskeletal:        General: No tenderness.     Cervical back: Normal range of motion.  Skin:    General: Skin is warm and dry.     Findings: No erythema or rash.  Neurological:     Mental Status: She is alert and oriented to person, place, and time.     ED Results /  Procedures / Treatments   Labs (all labs ordered are listed, but only abnormal results are displayed) Labs Reviewed  COMPREHENSIVE METABOLIC PANEL - Abnormal; Notable for the following components:      Result Value   Glucose, Bld 134 (*)    Creatinine, Ser 1.03 (*)    Albumin 3.4 (*)    AST 14 (*)    GFR, Estimated 58 (*)    All other components within normal limits  CBC WITH DIFFERENTIAL/PLATELET  TROPONIN I (HIGH SENSITIVITY)  TROPONIN I (HIGH SENSITIVITY)    EKG EKG Interpretation  Date/Time:  Saturday August 06 2021 06:55:01 EDT Ventricular Rate:  84 PR Interval:  171 QRS Duration: 88 QT Interval:  365 QTC Calculation: 432 R Axis:   10 Text Interpretation: Sinus rhythm Low voltage, precordial leads Similar TW abnormality to previous, baseline wander Confirmed by Alvira Monday ([id number]) on 08/06/2021 6:58:37 AM  Radiology DG Chest 2 View  Result Date: 08/06/2021 CLINICAL DATA:  70 year old female with chest pain on the right side for 2 days. EXAM: CHEST - 2 VIEW COMPARISON:  Portable chest 12/17/2019 and earlier. FINDINGS: Lung volumes and mediastinal contours not significantly changed since 2016, within normal limits. Visualized tracheal air column is within normal limits. Both lungs appear clear. No pneumothorax or pleural effusion. No acute osseous abnormality identified. Right ribs appear grossly stable and intact. Stable cholecystectomy clips. Negative visible bowel gas. IMPRESSION: No acute cardiopulmonary abnormality. Electronically Signed   By: Odessa Fleming M.D.   On: 08/06/2021 07:20    Procedures Procedures    Medications Ordered in ED Medications - No data to display  ED Course/ Medical Decision Making/ A&P  72 year old female with a history of diabetes, hypertension, hyperlipidemia, OSA, presents with concern for right-sided chest pain.    History of pain worse with arm movements, small movements, and palpation is most consistent with likely musculoskeletal  etiology--however given risk factors and presence of chest pain, will evaluate with EKG and labs.  EKG was personally evaluated and interpreted by me and showed no acute changes in comparison to prior.  Chest x-ray was independently interpreter shows no signs of pneumonia, pneumothorax, pulmonary edema.  Have low clinical suspicion for PE in the setting of no hypoxia, shortness of breath, tachycardia, tachypnea, asymmetric leg swelling.  She has normal bilateral upper and lower extremity pulses, history and exam are also not consistent with aortic dissection.  Doubt intra-abdominal etiology of pain with no tenderness or pain on history or exam.  Labs were completed and personally interpreted by me shows no anemia, no clinically significant electrolyte abnormalities.  Troponin is negative, and given significant pain with  movement and palpation have low suspicion for ACS.  Suspect likely musculoskeletal etiology of pain.  Given prescription for Flexeril, recommend PCP follow-up which she has on Monday.        Final Clinical Impression(s) / ED Diagnoses Final diagnoses:  Nonspecific chest pain  Chest wall pain    Rx / DC Orders ED Discharge Orders          Ordered    cyclobenzaprine (FLEXERIL) 10 MG tablet  3 times daily PRN,   Status:  Discontinued        08/06/21 0809    cyclobenzaprine (FLEXERIL) 10 MG tablet  3 times daily PRN        08/06/21 0809              Alvira Monday, MD 08/06/21 [phone]

## 2022-05-16 ENCOUNTER — Emergency Department (HOSPITAL_BASED_OUTPATIENT_CLINIC_OR_DEPARTMENT_OTHER)
Admission: EM | Admit: 2022-05-16 | Discharge: 2022-05-16 | Disposition: A | Discharge status: Home / Self Care | Payer: 59 | Attending: Emergency Medicine | Admitting: Emergency Medicine

## 2022-05-16 ENCOUNTER — Other Ambulatory Visit: Payer: Self-pay

## 2022-05-16 ENCOUNTER — Emergency Department (HOSPITAL_BASED_OUTPATIENT_CLINIC_OR_DEPARTMENT_OTHER): Payer: 59

## 2022-05-16 DIAGNOSIS — Z794 Long term (current) use of insulin: Secondary | ICD-10-CM | POA: Insufficient documentation

## 2022-05-16 DIAGNOSIS — R1031 Right lower quadrant pain: Secondary | ICD-10-CM

## 2022-05-16 DIAGNOSIS — Z79899 Other long term (current) drug therapy: Secondary | ICD-10-CM | POA: Diagnosis not present

## 2022-05-16 DIAGNOSIS — I10 Essential (primary) hypertension: Secondary | ICD-10-CM | POA: Insufficient documentation

## 2022-05-16 DIAGNOSIS — Z7984 Long term (current) use of oral hypoglycemic drugs: Secondary | ICD-10-CM | POA: Insufficient documentation

## 2022-05-16 DIAGNOSIS — E119 Type 2 diabetes mellitus without complications: Secondary | ICD-10-CM | POA: Insufficient documentation

## 2022-05-16 DIAGNOSIS — R110 Nausea: Secondary | ICD-10-CM | POA: Insufficient documentation

## 2022-05-16 DIAGNOSIS — R319 Hematuria, unspecified: Secondary | ICD-10-CM | POA: Insufficient documentation

## 2022-05-16 LAB — COMPREHENSIVE METABOLIC PANEL
ALT: 13 U/L (ref 0–44)
AST: 16 U/L (ref 15–41)
Albumin: 3.5 g/dL (ref 3.5–5.0)
Alkaline Phosphatase: 72 U/L (ref 38–126)
Anion gap: 8 (ref 5–15)
BUN: 12 mg/dL (ref 8–23)
CO2: 25 mmol/L (ref 22–32)
Calcium: 9.2 mg/dL (ref 8.9–10.3)
Chloride: 104 mmol/L (ref 98–111)
Creatinine, Ser: 0.82 mg/dL (ref 0.44–1.00)
GFR, Estimated: >60 mL/min (ref >60)
Glucose, Bld: 139 mg/dL — ABNORMAL HIGH (ref 70–99)
Potassium: 3.7 mmol/L (ref 3.5–5.1)
Sodium: 137 mmol/L (ref 135–145)
Total Bilirubin: 0.5 mg/dL (ref 0.3–1.2)
Total Protein: 7.8 g/dL (ref 6.5–8.1)

## 2022-05-16 LAB — URINALYSIS, ROUTINE W REFLEX MICROSCOPIC
Bilirubin Urine: NEGATIVE
Glucose, UA: NEGATIVE mg/dL
Ketones, ur: NEGATIVE mg/dL
Leukocytes,Ua: NEGATIVE
Nitrite: NEGATIVE
Protein, ur: NEGATIVE mg/dL
Specific Gravity, Urine: 1.02 (ref 1.005–1.030)
pH: 8.5 — ABNORMAL HIGH (ref 5.0–8.0)

## 2022-05-16 LAB — LIPASE, BLOOD: Lipase: 25 U/L (ref 11–51)

## 2022-05-16 LAB — CBC
HCT: 41.2 % (ref 36.0–46.0)
Hemoglobin: 12.9 g/dL (ref 12.0–15.0)
MCH: 26.8 pg (ref 26.0–34.0)
MCHC: 31.3 g/dL (ref 30.0–36.0)
MCV: 85.5 fL (ref 80.0–100.0)
Platelets: 270 10*3/uL (ref 150–400)
RBC: 4.82 MIL/uL (ref 3.87–5.11)
RDW: 15 % (ref 11.5–15.5)
WBC: 7 10*3/uL (ref 4.0–10.5)
nRBC: 0 % (ref 0.0–0.2)

## 2022-05-16 LAB — URINALYSIS, MICROSCOPIC (REFLEX)

## 2022-05-16 MED ORDER — IOHEXOL 300 MG/ML  SOLN
100.0000 mL | Freq: Once | INTRAMUSCULAR | Status: AC | PRN
  Administered 2022-05-16: 100 mL via INTRAVENOUS

## 2022-05-16 NOTE — ED Triage Notes (Signed)
Patient presents to ED via POV from home. Here with abdominal pain and nausea that began at 1600 today. History of diverticulitis, report this feels similar.

## 2022-05-16 NOTE — ED Notes (Signed)
Pt given apple juice and peanut butter crackers for PO challenge.

## 2022-05-16 NOTE — ED Notes (Signed)
Patient transported to CT 

## 2022-05-16 NOTE — ED Provider Notes (Signed)
[place] [facility]PART[facility] Provider Note   CSN: [id number] Arrival date & time: 05/16/22  1727     History  Chief Complaint  Patient presents with   Abdominal Pain    Cindy Ashley is a 72 y.o. female.   Abdominal Pain Associated symptoms: chills and nausea      72 year old female with medical history significant for DM2, HTN, HLD, diverticulitis presenting to the emergency department with right lower quadrant abdominal pain and chills.  The patient states that it felt very similar to her prior episodes of diverticulitis.  She states that this afternoon she developed sudden onset right lower quadrant abdominal pain with associated nausea.  She is been feeling chills all day.  She felt sharp pain in her right lower quadrant, no radiation.  Symptoms have been intermittent since that time.  They have since eased off some since waiting in the ER.  Her last bowel movement was yesterday and was normal, she is currently passing gas.  She is currently pain-free.  Home Medications Prior to Admission medications   Medication Sig Start Date End Date Taking? Authorizing Provider  Acyclovir (ZOVIRAX PO) Take by mouth.    [provider]  cyclobenzaprine (FLEXERIL) 10 MG tablet Take 1 tablet (10 mg total) by mouth 3 (three) times daily as needed for muscle spasms. 08/06/21   Gareth Morgan, MD  Hydrocodone-Acetaminophen (VICODIN PO) Take by mouth.    [provider]  insulin glargine (LANTUS) 100 UNIT/ML injection Inject into the skin at bedtime.    [provider]  LISINOPRIL PO Take by mouth.    [provider]  oxyCODONE (ROXICODONE) 5 MG immediate release tablet Take 1-2 tablets every 4-6 hours as needed for pain. 07/28/14   Pamella Pert, MD  oxyCODONE-acetaminophen (PERCOCET/ROXICET) 5-325 MG per tablet Take 1-2 tablets by mouth every 4 (four) hours as needed for pain. 04/23/12   Glendell Docker, NP  Pitavastatin Calcium  (LIVALO PO) Take by mouth.    [provider]  SitaGLIPtin-MetFORMIN HCl (JANUMET PO) Take by mouth.    [provider]      Allergies    Penicillins    Review of Systems   Review of Systems  Constitutional:  Positive for chills.  Gastrointestinal:  Positive for abdominal pain and nausea.  All other systems reviewed and are negative.   Physical Exam Updated Vital Signs BP (!) 145/74   Pulse 80   Temp 97.9 F (36.6 C) (Oral)   Resp 16   Ht 5\' 7"  (1.702 m)   Wt 105.7 kg   SpO2 97%   BMI 36.49 kg/m  Physical Exam Vitals and nursing note reviewed.  Constitutional:      General: She is not in acute distress.    Appearance: She is well-developed.  HENT:     Head: Normocephalic and atraumatic.  Eyes:     Conjunctiva/sclera: Conjunctivae normal.  Cardiovascular:     Rate and Rhythm: Normal rate and regular rhythm.     Heart sounds: No murmur heard. Pulmonary:     Effort: Pulmonary effort is normal. No respiratory distress.     Breath sounds: Normal breath sounds.  Abdominal:     Palpations: Abdomen is soft.     Tenderness: There is no abdominal tenderness. There is no guarding or rebound.  Musculoskeletal:        General: No swelling.     Cervical back: Neck supple.  Skin:    General: Skin is warm  and dry.     Capillary Refill: Capillary refill takes less than 2 seconds.  Neurological:     Mental Status: She is alert.  Psychiatric:        Mood and Affect: Mood normal.     ED Results / Procedures / Treatments   Labs (all labs ordered are listed, but only abnormal results are displayed) Labs Reviewed  COMPREHENSIVE METABOLIC PANEL - Abnormal; Notable for the following components:      Result Value   Glucose, Bld 139 (*)    All other components within normal limits  URINALYSIS, ROUTINE W REFLEX MICROSCOPIC - Abnormal; Notable for the following components:   pH 8.5 (*)    Hgb urine dipstick LARGE (*)    All other components within normal  limits  URINALYSIS, MICROSCOPIC (REFLEX) - Abnormal; Notable for the following components:   Bacteria, UA FEW (*)    All other components within normal limits  LIPASE, BLOOD  CBC    EKG None  Radiology CT ABDOMEN PELVIS W CONTRAST  Result Date: 05/16/2022 CLINICAL DATA:  Acute right lower quadrant abdominal pain. EXAM: CT ABDOMEN AND PELVIS WITH CONTRAST TECHNIQUE: Multidetector CT imaging of the abdomen and pelvis was performed using the standard protocol following bolus administration of intravenous contrast. RADIATION DOSE REDUCTION: This exam was performed according to the departmental dose-optimization program which includes automated exposure control, adjustment of the mA and/or kV according to patient size and/or use of iterative reconstruction technique. CONTRAST:  172mL OMNIPAQUE IOHEXOL 300 MG/ML  SOLN COMPARISON:  June 10, 2014. FINDINGS: Lower chest: No acute abnormality. Hepatobiliary: No focal liver abnormality is seen. Status post cholecystectomy. No biliary dilatation. Pancreas: Unremarkable. No pancreatic ductal dilatation or surrounding inflammatory changes. Spleen: Normal in size without focal abnormality. Adrenals/Urinary Tract: Adrenal glands are unremarkable. Kidneys are normal, without renal calculi, focal lesion, or hydronephrosis. Bladder is unremarkable. Stomach/Bowel: The stomach is unremarkable. The appendix is not visualized. There is no evidence of bowel obstruction or inflammation. Diverticulosis is noted throughout the colon without inflammation. Vascular/Lymphatic: Aortic atherosclerosis. No enlarged abdominal or pelvic lymph nodes. Reproductive: Status post hysterectomy. No adnexal masses. Other: No abdominal wall hernia or abnormality. No abdominopelvic ascites. Musculoskeletal: No acute or significant osseous findings. IMPRESSION: Diverticulosis is noted throughout the colon. No acute abnormality seen in the abdomen or pelvis. Aortic Atherosclerosis (ICD10-I70.0).  Electronically Signed   By: Marijo Conception M.D.   On: 05/16/2022 20:44    Procedures Procedures    Medications Ordered in ED Medications  iohexol (OMNIPAQUE) 300 MG/ML solution 100 mL (100 mLs Intravenous Contrast Given 05/16/22 2018)    ED Course/ Medical Decision Making/ A&P                             Medical Decision Making Amount and/or Complexity of Data Reviewed Labs: ordered. Radiology: ordered.  Risk Prescription drug management.     72 year old female with medical history significant for DM2, HTN, HLD, diverticulitis presenting to the emergency department with right lower quadrant abdominal pain and chills.  The patient states that it felt very similar to her prior episodes of diverticulitis.  She states that this afternoon she developed sudden onset right lower quadrant abdominal pain with associated nausea.  She is been feeling chills all day.  She felt sharp pain in her right lower quadrant, no radiation.  Symptoms have been intermittent since that time.  They have since eased off some since waiting in  the ER.  Her last bowel movement was yesterday and was normal, she is currently passing gas.  She is currently pain-free.  Vitals and telemetry on arrival: Afebrile, not tachycardic or tachypneic, BP 167/89, saturating 100% on room air.  Sinus rhythm noted on cardiac telemetry  Pertinent exam findings include: Abdomen soft, nontender, nondistended, no rebound or guarding  Differential Diagnosis: Likely diverticulitis, consider constipation.  Considered but feel less likely appendicitis, Bowel Obstruction, Pyelonephritis, Nephrolithiasis, Pancreatitis, Cholecystitis, Shingles, Perforated Bowel or Ulcer, Ischemic Mesentery, Inflammatory Bowel Disease, Strangulated/Incarcerated Hernia, gastritis, PUD.    Lab results include: Urinalysis without evidence of UTI, mild hematuria, CBC without a leukocytosis or anemia, CMP unremarkable, lipase normal.  Imaging results include:   CT abdomen pelvis with contrast: IMPRESSION:  Diverticulosis is noted throughout the colon.    No acute abnormality seen in the abdomen or pelvis.    Aortic Atherosclerosis (ICD10-I70.0).    The patient remains asymptomatic throughout her time in the emergency department.  There is no evidence of diverticulitis on CT imaging and her laboratory workup has been reassuring.  Her abdomen is soft, nontender, nondistended, no rebound or guarding.  She is tolerating oral intake.  Advised return precautions, overall stable for discharge at this time.  The patient has been appropriately medically screened and/or stabilized in the ED. I have low suspicion for any other emergent medical condition which would require further screening, evaluation or treatment in the ED or require inpatient management.  Final Clinical Impression(s) / ED Diagnoses Final diagnoses:  Right lower quadrant abdominal pain    Rx / DC Orders ED Discharge Orders     None         Regan Lemming, MD 05/16/22 2138

## 2022-05-16 NOTE — Discharge Instructions (Addendum)
Your CT scan showed no evidence of diverticulitis, no other acute abnormality and your laboratory evaluation was reassuring.  Your symptoms have resolved.  Return for any severe worsening of your symptoms.

## 2022-06-09 ENCOUNTER — Emergency Department (HOSPITAL_BASED_OUTPATIENT_CLINIC_OR_DEPARTMENT_OTHER)
Admission: EM | Admit: 2022-06-09 | Discharge: 2022-06-10 | Disposition: A | Discharge status: Home / Self Care | Payer: 59 | Attending: Emergency Medicine | Admitting: Emergency Medicine

## 2022-06-09 ENCOUNTER — Encounter (HOSPITAL_BASED_OUTPATIENT_CLINIC_OR_DEPARTMENT_OTHER): Payer: Self-pay | Admitting: Emergency Medicine

## 2022-06-09 ENCOUNTER — Other Ambulatory Visit: Payer: Self-pay

## 2022-06-09 DIAGNOSIS — R059 Cough, unspecified: Secondary | ICD-10-CM | POA: Diagnosis present

## 2022-06-09 DIAGNOSIS — Z794 Long term (current) use of insulin: Secondary | ICD-10-CM | POA: Insufficient documentation

## 2022-06-09 DIAGNOSIS — J101 Influenza due to other identified influenza virus with other respiratory manifestations: Secondary | ICD-10-CM | POA: Insufficient documentation

## 2022-06-09 DIAGNOSIS — Z1152 Encounter for screening for COVID-19: Secondary | ICD-10-CM | POA: Diagnosis not present

## 2022-06-09 LAB — RESP PANEL BY RT-PCR (RSV, FLU A&B, COVID)  RVPGX2
Influenza A by PCR: POSITIVE — AB
Influenza B by PCR: NEGATIVE
Resp Syncytial Virus by PCR: NEGATIVE
SARS Coronavirus 2 by RT PCR: NEGATIVE

## 2022-06-09 MED ORDER — BENZONATATE 100 MG PO CAPS
200.0000 mg | ORAL_CAPSULE | Freq: Once | ORAL | Status: AC
  Administered 2022-06-10: 200 mg via ORAL
  Filled 2022-06-09: qty 2

## 2022-06-09 MED ORDER — BENZONATATE 100 MG PO CAPS: 100.0000 mg | ORAL_CAPSULE | Freq: Three times a day (TID) | ORAL | 0 refills | Status: AC

## 2022-06-09 NOTE — ED Provider Notes (Signed)
[place] [facility] AT [facility] Provider Note   CSN: [id number] Arrival date & time: 06/09/22  2109     History  Chief Complaint  Patient presents with   Cough    Cindy Ashley is a 72 y.o. female.  The history is provided by the patient.  Cough Cough characteristics:  Non-productive Severity:  Moderate Onset quality:  Gradual Duration:  3 days Timing:  Intermittent Progression:  Unchanged Chronicity:  New Context: upper respiratory infection   Relieved by:  Nothing Worsened by:  Nothing Ineffective treatments:  Decongestant Associated symptoms: sinus congestion   Associated symptoms: no chest pain, no fever and no shortness of breath   Risk factors: no chemical exposure        Home Medications Prior to Admission medications   Medication Sig Start Date End Date Taking? Authorizing Provider  Acyclovir (ZOVIRAX PO) Take by mouth.    [provider]  cyclobenzaprine (FLEXERIL) 10 MG tablet Take 1 tablet (10 mg total) by mouth 3 (three) times daily as needed for muscle spasms. 08/06/21   Alvira Monday, MD  Hydrocodone-Acetaminophen (VICODIN PO) Take by mouth.    [provider]  insulin glargine (LANTUS) 100 UNIT/ML injection Inject into the skin at bedtime.    [provider]  LISINOPRIL PO Take by mouth.    [provider]  oxyCODONE (ROXICODONE) 5 MG immediate release tablet Take 1-2 tablets every 4-6 hours as needed for pain. 07/28/14   Purvis Sheffield, MD  oxyCODONE-acetaminophen (PERCOCET/ROXICET) 5-325 MG per tablet Take 1-2 tablets by mouth every 4 (four) hours as needed for pain. 04/23/12   Teressa Lower, NP  Pitavastatin Calcium (LIVALO PO) Take by mouth.    [provider]  SitaGLIPtin-MetFORMIN HCl (JANUMET PO) Take by mouth.    [provider]      Allergies    Penicillins    Review of Systems   Review of Systems  Constitutional:  Negative for fever.  HENT:  Positive  for congestion. Negative for facial swelling.   Eyes:  Negative for redness.  Respiratory:  Positive for cough. Negative for shortness of breath.   Cardiovascular:  Negative for chest pain.  All other systems reviewed and are negative.   Physical Exam Updated Vital Signs BP (!) 185/88 (BP Location: Left Arm)   Pulse 86   Temp 99 F (37.2 C) (Oral)   Resp 18   Ht  (1.702 m)   Wt 103 kg   SpO2 97%   BMI 35.55 kg/m  Physical Exam Vitals and nursing note reviewed.  Constitutional:      General: She is not in acute distress.    Appearance: Normal appearance. She is well-developed.  HENT:     Head: Normocephalic and atraumatic.     Nose: Congestion present.  Eyes:     Pupils: Pupils are equal, round, and reactive to light.  Cardiovascular:     Rate and Rhythm: Normal rate and regular rhythm.     Pulses: Normal pulses.     Heart sounds: Normal heart sounds.  Pulmonary:     Effort: Pulmonary effort is normal. No respiratory distress.     Breath sounds: Normal breath sounds. No stridor. No wheezing, rhonchi or rales.  Chest:     Chest wall: No tenderness.  Abdominal:     General: Bowel sounds are normal. There is no distension.     Palpations: Abdomen is soft.     Tenderness: There is no abdominal tenderness.  There is no guarding or rebound.  Genitourinary:    Vagina: No vaginal discharge.  Musculoskeletal:        General: Normal range of motion.     Cervical back: Neck supple.  Skin:    General: Skin is warm and dry.     Capillary Refill: Capillary refill takes less than 2 seconds.     Findings: No erythema or rash.  Neurological:     General: No focal deficit present.     Mental Status: She is alert and oriented to person, place, and time.     Deep Tendon Reflexes: Reflexes normal.  Psychiatric:        Mood and Affect: Mood normal.        Behavior: Behavior normal.     ED Results / Procedures / Treatments   Labs (all labs ordered are listed, but only  abnormal results are displayed) Labs Reviewed  RESP PANEL BY RT-PCR (RSV, FLU A&B, COVID)  RVPGX2 - Abnormal; Notable for the following components:      Result Value   Influenza A by PCR POSITIVE (*)    All other components within normal limits    EKG None  Radiology No results found.  Procedures Procedures    Medications Ordered in ED Medications  benzonatate (TESSALON) capsule 200 mg (has no administration in time range)    ED Course/ Medical Decision Making/ A&P                             Medical Decision Making Patient with cough and congestion  Amount and/or Complexity of Data Reviewed External Data Reviewed: notes.    Details: Previous notes reviewed  Labs: ordered.    Details: Flu A positive   Risk Prescription drug management. Risk Details: Well appearing with normal exam.  Will start tessalon.  Stable for discharge.  Strict return   Final Clinical Impression(s) / ED Diagnoses Final diagnoses:  None   Return for intractable cough, coughing up blood, fevers > 100.4 unrelieved by medication, shortness of breath, intractable vomiting, chest pain, shortness of breath, weakness, numbness, changes in speech, facial asymmetry, abdominal pain, passing out, Inability to tolerate liquids or food, cough, altered mental status or any concerns. No signs of systemic illness or infection. The patient is nontoxic-appearing on exam and vital signs are within normal limits.  I have reviewed the triage vital signs and the nursing notes. Pertinent labs & imaging results that were available during my care of the patient were reviewed by me and considered in my medical decision making (see chart for details). After history, exam, and medical workup I feel the patient has been appropriately medically screened and is safe for discharge home. Pertinent diagnoses were discussed with the patient. Patient was given return precautions.    Rx / DC Orders ED Discharge Orders     None          Harvis Mabus, MD 06/09/22 2358

## 2022-06-09 NOTE — ED Triage Notes (Signed)
Patient arrived via POV c/o cough with nasal congestion x 2 days. Patient states cough with itchy throat and congestion. Patient tried OTC medications with no relief. Patient is AO x 4, VS w/ elevated BP, normal gait.

## 2022-06-10 DIAGNOSIS — J101 Influenza due to other identified influenza virus with other respiratory manifestations: Secondary | ICD-10-CM | POA: Diagnosis not present

## 2023-03-10 ENCOUNTER — Emergency Department (HOSPITAL_BASED_OUTPATIENT_CLINIC_OR_DEPARTMENT_OTHER)
Admission: EM | Admit: 2023-03-10 | Discharge: 2023-03-10 | Disposition: A | Discharge status: Home / Self Care | Payer: 59 | Attending: Emergency Medicine | Admitting: Emergency Medicine

## 2023-03-10 ENCOUNTER — Other Ambulatory Visit: Payer: Self-pay

## 2023-03-10 ENCOUNTER — Encounter (HOSPITAL_BASED_OUTPATIENT_CLINIC_OR_DEPARTMENT_OTHER): Payer: Self-pay | Admitting: Emergency Medicine

## 2023-03-10 DIAGNOSIS — S1016XA Insect bite (nonvenomous) of throat, initial encounter: Secondary | ICD-10-CM | POA: Insufficient documentation

## 2023-03-10 DIAGNOSIS — Z794 Long term (current) use of insulin: Secondary | ICD-10-CM | POA: Insufficient documentation

## 2023-03-10 DIAGNOSIS — W57XXXA Bitten or stung by nonvenomous insect and other nonvenomous arthropods, initial encounter: Secondary | ICD-10-CM | POA: Diagnosis not present

## 2023-03-10 NOTE — ED Provider Notes (Signed)
[place] [facility] AT [facility] Provider Note   CSN: [id number] Arrival date & time: 03/10/23  1812     History  Chief Complaint  Patient presents with   Insect Bite    Cindy Ashley is a 73 y.o. female.  38 yoF with a chief complaints of a possible insect bite or sting.  She said that she was at a funeral and she was waiting for the next event and she felt something on her neck and when she rubbed it she felt something bite or sting her.  It got her a couple times.  It is a bit itchy now.  She has been suffering from a sinus infection and is currently on erythromycin.  She think she has about 2 or 3 days more to go.        Home Medications Prior to Admission medications   Medication Sig Start Date End Date Taking? Authorizing Provider  Acyclovir (ZOVIRAX PO) Take by mouth.    [provider]  benzonatate (TESSALON) 100 MG capsule Take 1 capsule (100 mg total) by mouth every 8 (eight) hours. 06/09/22   Palumbo, April, MD  cyclobenzaprine (FLEXERIL) 10 MG tablet Take 1 tablet (10 mg total) by mouth 3 (three) times daily as needed for muscle spasms. 08/06/21   Alvira Monday, MD  Hydrocodone-Acetaminophen (VICODIN PO) Take by mouth.    [provider]  insulin glargine (LANTUS) 100 UNIT/ML injection Inject into the skin at bedtime.    [provider]  LISINOPRIL PO Take by mouth.    [provider]  oxyCODONE (ROXICODONE) 5 MG immediate release tablet Take 1-2 tablets every 4-6 hours as needed for pain. 07/28/14   Purvis Sheffield, MD  oxyCODONE-acetaminophen (PERCOCET/ROXICET) 5-325 MG per tablet Take 1-2 tablets by mouth every 4 (four) hours as needed for pain. 04/23/12   Teressa Lower, NP  Pitavastatin Calcium (LIVALO PO) Take by mouth.    [provider]  SitaGLIPtin-MetFORMIN HCl (JANUMET PO) Take by mouth.    [provider]      Allergies    Penicillins and Aspirin    Review of Systems    Review of Systems  Physical Exam Updated Vital Signs BP (!) 160/79 (BP Location: Right Arm)   Pulse 93   Temp 98.3 F (36.8 C)   Resp 12   Ht 5\' 7"  (1.702 m)   Wt 106.6 kg   SpO2 100%   BMI 36.81 kg/m  Physical Exam Vitals and nursing note reviewed.  Constitutional:      General: She is not in acute distress.    Appearance: She is well-developed. She is not diaphoretic.  HENT:     Head: Normocephalic and atraumatic.     Comments: 3 discrete erythematous areas under the jaw. Eyes:     Pupils: Pupils are equal, round, and reactive to light.  Cardiovascular:     Rate and Rhythm: Normal rate and regular rhythm.     Heart sounds: No murmur heard.    No friction rub. No gallop.  Pulmonary:     Effort: Pulmonary effort is normal.     Breath sounds: No wheezing or rales.  Abdominal:     General: There is no distension.     Palpations: Abdomen is soft.     Tenderness: There is no abdominal tenderness.  Musculoskeletal:        General: No tenderness.     Cervical back: Normal range of motion and neck supple.  Skin:  General: Skin is warm and dry.  Neurological:     Mental Status: She is alert and oriented to person, place, and time.  Psychiatric:        Behavior: Behavior normal.     ED Results / Procedures / Treatments   Labs (all labs ordered are listed, but only abnormal results are displayed) Labs Reviewed - No data to display  EKG None  Radiology No results found.  Procedures Procedures    Medications Ordered in ED Medications - No data to display  ED Course/ Medical Decision Making/ A&P                                 Medical Decision Making  73 yo F with a chief complaints of a possible insect bite or sting.  This occurred earlier today.  Looks consistent with an insect bite or sting on exam.  Will have a treat supportively.  No signs of anaphylaxis.  PCP follow-up.  6:41 PM:  I have discussed the diagnosis/risks/treatment options with the  patient.  Evaluation and diagnostic testing in the emergency department does not suggest an emergent condition requiring admission or immediate intervention beyond what has been performed at this time.  They will follow up with PCP. We also discussed returning to the ED immediately if new or worsening sx occur. We discussed the sx which are most concerning (e.g., sudden worsening pain, fever, inability to tolerate by mouth, anaphylaxis s/sx) that necessitate immediate return. Medications administered to the patient during their visit and any new prescriptions provided to the patient are listed below.  Medications given during this visit Medications - No data to display   The patient appears reasonably screen and/or stabilized for discharge and I doubt any other medical condition or other [facility] requiring further screening, evaluation, or treatment in the ED at this time prior to discharge.          Final Clinical Impression(s) / ED Diagnoses Final diagnoses:  Insect bite of throat, initial encounter    Rx / DC Orders ED Discharge Orders     None         Melene Plan, DO 03/10/23 1842

## 2023-03-10 NOTE — ED Triage Notes (Signed)
Pt c/o insect bite to RT side neck today; slight swelling noted, c/o itching

## 2023-03-10 NOTE — Discharge Instructions (Signed)
These are really uncommon to get infected unless you are scratching them a lot.  Antihistamines tend to help quite a bit.  You can take Benadryl or you could take a nonsedating antihistamine that is over-the-counter.  Medicine like ibuprofen and naproxen also tend to help with this.  Please return for rapid spreading or if you develop difficulty breathing or swallowing.

## 2023-04-18 ENCOUNTER — Emergency Department (HOSPITAL_BASED_OUTPATIENT_CLINIC_OR_DEPARTMENT_OTHER)
Admission: EM | Admit: 2023-04-18 | Discharge: 2023-04-18 | Disposition: A | Discharge status: Home / Self Care | Payer: 59 | Attending: Emergency Medicine | Admitting: Emergency Medicine

## 2023-04-18 ENCOUNTER — Encounter (HOSPITAL_BASED_OUTPATIENT_CLINIC_OR_DEPARTMENT_OTHER): Payer: Self-pay | Admitting: Emergency Medicine

## 2023-04-18 ENCOUNTER — Emergency Department (HOSPITAL_BASED_OUTPATIENT_CLINIC_OR_DEPARTMENT_OTHER): Payer: 59

## 2023-04-18 ENCOUNTER — Other Ambulatory Visit: Payer: Self-pay

## 2023-04-18 DIAGNOSIS — R109 Unspecified abdominal pain: Secondary | ICD-10-CM

## 2023-04-18 DIAGNOSIS — E119 Type 2 diabetes mellitus without complications: Secondary | ICD-10-CM | POA: Insufficient documentation

## 2023-04-18 DIAGNOSIS — K5732 Diverticulitis of large intestine without perforation or abscess without bleeding: Secondary | ICD-10-CM | POA: Insufficient documentation

## 2023-04-18 DIAGNOSIS — K5792 Diverticulitis of intestine, part unspecified, without perforation or abscess without bleeding: Secondary | ICD-10-CM

## 2023-04-18 DIAGNOSIS — I10 Essential (primary) hypertension: Secondary | ICD-10-CM | POA: Insufficient documentation

## 2023-04-18 DIAGNOSIS — A599 Trichomoniasis, unspecified: Secondary | ICD-10-CM | POA: Diagnosis not present

## 2023-04-18 LAB — CBC WITH DIFFERENTIAL/PLATELET
Abs Immature Granulocytes: 0.02 10*3/uL (ref 0.00–0.07)
Basophils Absolute: 0.1 10*3/uL (ref 0.0–0.1)
Basophils Relative: 1 %
Eosinophils Absolute: 0.1 10*3/uL (ref 0.0–0.5)
Eosinophils Relative: 1 %
HCT: 40.6 % (ref 36.0–46.0)
Hemoglobin: 12.7 g/dL (ref 12.0–15.0)
Immature Granulocytes: 0 %
Lymphocytes Relative: 38 %
Lymphs Abs: 2.1 10*3/uL (ref 0.7–4.0)
MCH: 27.4 pg (ref 26.0–34.0)
MCHC: 31.3 g/dL (ref 30.0–36.0)
MCV: 87.7 fL (ref 80.0–100.0)
Monocytes Absolute: 0.5 10*3/uL (ref 0.1–1.0)
Monocytes Relative: 8 %
Neutro Abs: 3 10*3/uL (ref 1.7–7.7)
Neutrophils Relative %: 52 %
Platelets: 248 10*3/uL (ref 150–400)
RBC: 4.63 MIL/uL (ref 3.87–5.11)
RDW: 14.3 % (ref 11.5–15.5)
WBC: 5.7 10*3/uL (ref 4.0–10.5)
nRBC: 0 % (ref 0.0–0.2)

## 2023-04-18 LAB — COMPREHENSIVE METABOLIC PANEL
ALT: 15 U/L (ref 0–44)
AST: 15 U/L (ref 15–41)
Albumin: 3.3 g/dL — ABNORMAL LOW (ref 3.5–5.0)
Alkaline Phosphatase: 59 U/L (ref 38–126)
Anion gap: 8 (ref 5–15)
BUN: 12 mg/dL (ref 8–23)
CO2: 22 mmol/L (ref 22–32)
Calcium: 8.8 mg/dL — ABNORMAL LOW (ref 8.9–10.3)
Chloride: 106 mmol/L (ref 98–111)
Creatinine, Ser: 0.76 mg/dL (ref 0.44–1.00)
GFR, Estimated: >60 mL/min (ref >60)
Glucose, Bld: 132 mg/dL — ABNORMAL HIGH (ref 70–99)
Potassium: 3.6 mmol/L (ref 3.5–5.1)
Sodium: 136 mmol/L (ref 135–145)
Total Bilirubin: 0.5 mg/dL (ref 0.0–1.2)
Total Protein: 6.6 g/dL (ref 6.5–8.1)

## 2023-04-18 LAB — URINALYSIS, ROUTINE W REFLEX MICROSCOPIC
Bilirubin Urine: NEGATIVE
Glucose, UA: NEGATIVE mg/dL
Hgb urine dipstick: NEGATIVE
Ketones, ur: NEGATIVE mg/dL
Nitrite: NEGATIVE
Protein, ur: NEGATIVE mg/dL
Specific Gravity, Urine: 1.015 (ref 1.005–1.030)
pH: 5 (ref 5.0–8.0)

## 2023-04-18 LAB — URINALYSIS, MICROSCOPIC (REFLEX): RBC / HPF: NONE SEEN RBC/hpf (ref 0–5)

## 2023-04-18 LAB — LIPASE, BLOOD: Lipase: 25 U/L (ref 11–51)

## 2023-04-18 MED ORDER — METRONIDAZOLE 500 MG PO TABS
2000.0000 mg | ORAL_TABLET | Freq: Once | ORAL | Status: AC
  Administered 2023-04-18: 2000 mg via ORAL
  Filled 2023-04-18: qty 4

## 2023-04-18 MED ORDER — CIPROFLOXACIN HCL 500 MG PO TABS
500.0000 mg | ORAL_TABLET | Freq: Once | ORAL | Status: AC
  Administered 2023-04-18: 500 mg via ORAL
  Filled 2023-04-18: qty 1

## 2023-04-18 MED ORDER — METRONIDAZOLE 500 MG PO TABS: 500.0000 mg | ORAL_TABLET | Freq: Three times a day (TID) | ORAL | 0 refills | Status: AC

## 2023-04-18 MED ORDER — CIPROFLOXACIN HCL 500 MG PO TABS: 500.0000 mg | ORAL_TABLET | Freq: Two times a day (BID) | ORAL | 0 refills | Status: AC

## 2023-04-18 NOTE — ED Notes (Signed)

## 2023-04-18 NOTE — ED Triage Notes (Signed)
 Pt c/o RT flank pain x 11d; trouble sleeping d/t pain; was seen at PCP and had XRs, but they have not been read and pain is getting worse

## 2023-04-18 NOTE — Discharge Instructions (Signed)
We believe your symptoms are caused by diverticulitis.  Most of the time this condition (please read through the included information) can be cured with outpatient antibiotics.  Please take the full course of prescribed medication(s) and follow up with the doctors recommended above.  Return to the ED if your abdominal pain worsens or fails to improve, you develop bloody vomiting, bloody diarrhea, you are unable to tolerate fluids due to vomiting, fever greater than 101, or other symptoms that concern you.    

## 2023-04-18 NOTE — ED Provider Notes (Signed)
 Emergency Department Provider Note   I have reviewed the triage vital signs and the nursing notes.   HISTORY  Chief Complaint Flank Pain   HPI Cindy Ashley is a 73 y.o. female past history of diabetes, hypercholesterolemia, hypertension presents emergency department with right flank pain.  Symptoms been present for the past 11 days.  No clear provoking factors.  Pain is worse with moving or touching the area.  She has pain to the right flank radiating slightly anterior.  No chest pain or shortness of breath.  No fevers or chills.  No productive cough.  She denies any vaginal bleeding but has noticed some increased vaginal "wetness" since having sex 2 months prior. No UTI symptoms.    Past Medical History:  Diagnosis Date   Diabetes mellitus without complication (HCC)    High cholesterol    Hypertension    Sleep apnea    Vitamin D deficiency     Review of Systems  Constitutional: No fever/chills Cardiovascular: Denies chest pain. Respiratory: Denies shortness of breath. Gastrointestinal: Positive right flank/abdominal pain.  No nausea, no vomiting.  No diarrhea.  No constipation. Genitourinary: Negative for dysuria. Musculoskeletal: Negative for back pain. Skin: Negative for rash. Neurological: Negative for headaches.  ____________________________________________   PHYSICAL EXAM:  VITAL SIGNS: ED Triage Vitals  Encounter Vitals Group     BP 04/18/23 1556 (!) 144/77     Pulse Rate 04/18/23 1556 89     Resp 04/18/23 1556 18     Temp 04/18/23 1556 97.8 F (36.6 C)     Temp src --      SpO2 04/18/23 1556 100 %     Weight 04/18/23 1601 245 lb (111.1 kg)     Height 04/18/23 1601 5\' 7"  (1.702 m)   Constitutional: Alert and oriented. Well appearing and in no acute distress. Eyes: Conjunctivae are normal.  Head: Atraumatic. Nose: No congestion/rhinnorhea. Mouth/Throat: Mucous membranes are moist.  Neck: No stridor.  Cardiovascular: Normal rate, regular rhythm.  Good peripheral circulation. Grossly normal heart sounds.   Respiratory: Normal respiratory effort.  No retractions. Lungs CTAB. Gastrointestinal: Soft and nontender. No distention.  Musculoskeletal: No gross deformities of extremities. Neurologic:  Normal speech and language.  Skin:  Skin is warm, dry and intact. No rash noted.   ____________________________________________   LABS (all labs ordered are listed, but only abnormal results are displayed)  Labs Reviewed  URINALYSIS, ROUTINE W REFLEX MICROSCOPIC - Abnormal; Notable for the following components:      Result Value   APPearance HAZY (*)    Leukocytes,Ua TRACE (*)    All other components within normal limits  COMPREHENSIVE METABOLIC PANEL - Abnormal; Notable for the following components:   Glucose, Bld 132 (*)    Calcium 8.8 (*)    Albumin 3.3 (*)    All other components within normal limits  URINALYSIS, MICROSCOPIC (REFLEX) - Abnormal; Notable for the following components:   Bacteria, UA RARE (*)    Trichomonas, UA PRESENT (*)    All other components within normal limits  CBC WITH DIFFERENTIAL/PLATELET  LIPASE, BLOOD  GC/CHLAMYDIA PROBE AMP ([place]) NOT AT [facility]   ____________________________________________  RADIOLOGY  No results found.  ____________________________________________   PROCEDURES  Procedure(s) performed:   Procedures  None  ____________________________________________   INITIAL IMPRESSION / ASSESSMENT AND PLAN / ED COURSE  Pertinent labs & imaging results that were available during my care of the patient were reviewed by me and considered in my medical decision making (  see chart for details).   This patient is Presenting for Evaluation of flank pain, which does require a range of treatment options, and is a complaint that involves a high risk of morbidity and mortality.  The Differential Diagnoses includes but is not exclusive to ovarian cyst, ovarian torsion, acute appendicitis,  urinary tract infection, endometriosis, bowel obstruction, hernia, colitis, renal colic, gastroenteritis, volvulus etc.   Clinical Laboratory Tests Ordered, included UA positive for trichomonas but no evidence of urine infection.  No symptoms to consider UTI.  LFTs, bilirubin, lipase are normal.  CBC without leukocytosis.  Radiologic Tests Ordered, included CT renal. I independently interpreted the images and agree with radiology interpretation.   Cardiac Monitor Tracing which shows NSR.    Social Determinants of Health Risk patient is a non-smoker.   Consult complete with  Medical Decision Making: Summary:  Patient presents emergency department for evaluation of right flank pain for the past 11 days.  Some vaginal changes as well but no frank discharge.  Will send urine gonorrhea and chlamydia screening as her UA did show positive for trichomonas.  Plan for one-time treatment here.  She did have an unprotected sexual encounter 2 months ago and plans to alert that individual regarding her positive test.  I do not think this explains her right flank pain and we will move forward with CT renal but labs are overall reassuring.  Possible musculoskeletal etiology.  Location of pain is securely within the abdomen and not consistent with lower lobe PE or other cardiorespiratory etiology.  Reevaluation with update and discussion with   ***Considered admission***  Patient's presentation is most consistent with acute presentation with potential threat to life or bodily function.   Disposition:   ____________________________________________  FINAL CLINICAL IMPRESSION(S) / ED DIAGNOSES  Final diagnoses:  None     NEW OUTPATIENT MEDICATIONS STARTED DURING THIS VISIT:  New Prescriptions   No medications on file    Note:  This document was prepared using Dragon voice recognition software and may include unintentional dictation errors.  Alona Bene, MD, [facility] Emergency Medicine

## 2023-04-19 LAB — GC/CHLAMYDIA PROBE AMP (~~LOC~~) NOT AT ARMC
Chlamydia: NEGATIVE
Comment: NEGATIVE
Comment: NORMAL
Neisseria Gonorrhea: NEGATIVE

## 2024-01-23 NOTE — Progress Notes (Signed)
 "  [facility] Ophthalmology - [facility] Visit Note 01/23/24     CHIEF COMPLAINT Patient presents for Follow Up Exam   HISTORY OF PRESENT ILLNESS: Cindy Ashley is a 73 y.o. female who presents to the clinic today for:   HPI   LV: 12/19/2023  Patient here for 5 WK IOP CK, MRX, OCT MAC, no dilation.  Patient states vision is getting slightly less blurred but still fuzzy overall. Cannot read small print, unless she makes the font larger. Feels she sees a lot better at night.  Comfort of eyes is good. Denies eye pain, redness, dryness, tearing or photophobia.  Denies flashes, new floaters or dark curtain/veil.   Using: Prednisolone 4x daily both eyes. LU around 10 AM today.  Bromfenac 1x daily both eyes. LU 10 AM today.  Uses consistently.   Last edited by Estefana LOISE Sar on 01/23/2024  3:21 PM.      CURRENT MEDICATIONS: Medications Ordered Prior to Encounter[1]  Referring physician: No referring provider defined for this encounter.  ALLERGIES Allergies[2]  PAST MEDICAL HISTORY Medical History[3] Surgical History[4]  FAMILY HISTORY Family History[5]  SOCIAL HISTORY Social History[6]        OPHTHALMIC EXAM:  Base Eye Exam     Visual Acuity (Snellen - Linear)       Right Left   Dist [place] 20/70 -1 20/40 -2+2         Tonometry (Applanation, 3:32 PM)       Right Left   Pressure 13 15  Holding lids        Pupils       Pupils APD   Right PERRL None   Left PERRL None         Visual Fields       Left Right    Full Full         Extraocular Movement       Right Left    Full Full         Neuro/Psych     Oriented x3: Yes   Mood/Affect: Normal           Slit Lamp and Fundus Exam     External Exam       Right Left   External Normal Normal         Slit Lamp Exam       Right Left   Lids/Lashes Normal Normal   Conjunctiva/Sclera White and quiet White and quiet   Cornea PEK PEK   Anterior Chamber Deep and quiet Deep  and quiet   Iris Round and reactive Round and reactive   Lens PC IOL PC IOL         Fundus Exam       Right Left   Disc Normal Normal   C/D Ratio 0.3 0.2   Macula ERM, CME CME           Refraction     Wearing Rx     Type: none         Manifest Refraction       Sphere Cylinder Axis Dist VA Add Near [name]   Right +2.00 -3.50 167 20/30-2 +2.50 J1+ -2   Left +1.25 -3.00 178 20/30+2 +2.50 J1 +2         Manifest Refraction #2 (Auto)       Sphere Cylinder Axis Dist VA Add Near [name]   Right +1.75 -3.50 168      Left +1.00 -3.00 178  Final Rx       Sphere Cylinder Axis Dist VA Add Near [name]   Right +2.00 -3.50 167 20/30-2 +2.50 J1+ -2   Left +1.25 -3.00 178 20/30+2 +2.50 J1 +2    Expiration Date: 01/22/2026  Remark: UV filter, Polycarbonate, Tint of choice Doctor Recommendations:Anti-Reflective                IMAGING AND PROCEDURES: OCT, Macula - OU - Both Eyes       Optical Coherence Tomography  Test Date: 01/23/2024.   Right Eye  Patient Cooperation: good.  Findings: macular edema.  Comparison/Progression: OCT findings have improved.   Left Eye  Patient Cooperation: good.  Technically good study.  Findings: macular edema.  Comparison/Progression: OCT findings have improved.   Notes Cme - improved, still present  Cme mostly resolved        ASSESSMENT/PLAN:  1. Cystoid macular edema following cataract surgery, right eye  OCT, Macula - OU - Both Eyes    2. Cystoid macular edema of left eye following cataract surgery  OCT, Macula - OU - Both Eyes    3. 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             Hold on glasses RX today  Medication ordered this visit:  New Medications Ordered This Visit  Medications   prednisoLONE acetate (PRED FORTE) 1 % ophthalmic suspension    Sig: Administer 1 drop into both eyes 4 (four) times a day.    Dispense:  5 mL    Refill:  2   bromfenac 0.09 % drop    Sig: Administer 1 drop into both eyes daily.    Dispense:  1.7 mL    Refill:  1       Return in about 4 weeks (around 02/20/2024) for IOP, AR/MR, mac no dil .  Patient Instructions  BOTH EYES:   - Continue Bromfenac once a day until next appointment  - Continue Prednisolone acetate 1%: 1 drop 4 times daily until returning  Explained the diagnoses, plan, and follow up with the patient and they expressed understanding.  Patient expressed understanding of the importance of proper follow up care.   This document serves as a record of services personally performed by Comer Jerilee Lauth, MD.  It was created on their behalf by Harlene Earnie Ming, COA, a trained medical scribe, and Certified Ophthalmic Assistant (COA). During the course of documenting the history, physical exam and medical decision making, I was functioning as a stage manager. The creation of this record is the providers dictation and/or activities during the visit.  Electronically signed by Harlene Earnie Ming, COA 01/23/2024 3:39 PM    Abbreviations: M myopia (nearsighted); A astigmatism; H hyperopia (farsighted); P presbyopia; Mrx spectacle prescription;  CTL contact lenses; OD right eye; OS left eye; OU both eyes  XT exotropia; ET  esotropia; PEK punctate epithelial keratitis; PEE punctate epithelial erosions; DES dry eye syndrome; MGD meibomian gland dysfunction; ATs artificial tears; PFAT's preservative free artificial tears; NSC nuclear sclerotic cataract; PSC posterior subcapsular cataract; ERM epi-retinal membrane; PVD posterior vitreous detachment; RD retinal  detachment; DM diabetes mellitus; DR diabetic retinopathy; NPDR non-proliferative diabetic retinopathy; PDR proliferative diabetic retinopathy; CSME clinically significant macular edema; DME diabetic macular edema; dbh dot blot hemorrhages; CWS cotton wool spot; POAG primary open angle glaucoma; C/D cup-to-disc ratio; HVF humphrey visual field; GVF goldmann visual field; OCT optical coherence tomography; IOP intraocular pressure; BRVO Branch retinal vein occlusion; CRVO central retinal vein occlusion; CRAO central retinal artery occlusion; BRAO branch retinal artery occlusion; RT retinal tear; SB scleral buckle; PPV pars plana vitrectomy; VH Vitreous hemorrhage; PRP panretinal laser photocoagulation; IVK intravitreal kenalog; VMT vitreomacular traction; MH Macular hole;  NVD neovascularization of the disc; NVE neovascularization elsewhere; AREDS age related eye disease study; ARMD age related macular degeneration; POAG primary open angle glaucoma; EBMD epithelial/anterior basement membrane dystrophy; ACIOL anterior chamber intraocular lens; IOL intraocular lens; PCIOL posterior chamber intraocular lens; Phaco/IOL phacoemulsification with intraocular lens placement; PRK photorefractive keratectomy; LASIK laser assisted in situ keratomileusis; HTN hypertension; DM diabetes mellitus; COPD chronic obstructive pulmonary disease   Electronically signed by: Comer Jerilee Lauth, MD 01/23/2024 3:48 PM        [1] Current Outpatient Medications on File Prior to Visit  Medication Sig Dispense Refill   allopurinoL (ZYLOPRIM) 100 mg tablet daily.     ergocalciferol (VITAMIN D2) 1,250 mcg (50,000 unit) capsule TK 1 C PO 1 TIME A WK  3   ezetimibe (ZETIA) 10 mg tablet Take 1 tablet by mouth daily.     gabapentin (NEURONTIN) 300 mg capsule Take 1 capsule at bedtime. 30 capsule 0   Mounjaro 10 mg/0.5 mL pnij every 7 days Indications: type 2 diabetes mellitus. Take on Mondays     oxyCODONE  (ROXICODONE ) 15 mg  immediate release tablet 4 (four) times a day.  0   triamcinolone (Nasacort) 55 mcg spra spray Administer  into affected nostril(s).     [DISCONTINUED] bromfenac 0.09 % drop Administer 1 drop into both eyes daily. 1.7 mL 1   [DISCONTINUED] prednisoLONE acetate (PRED FORTE) 1 % ophthalmic suspension Administer 1 drop into both eyes 4 (four) times a day. 5 mL 2   dulaglutide (Trulicity) 1.5 mg/0.5 mL pnij Inject  under the skin once a week.     Lantus Solostar U-100 Insulin 100 unit/mL (3 mL) pen   2   losartan (COZAAR) 100 mg tablet   1   meloxicam (MOBIC) 15 mg tablet TAKE 1 TABLET(15 MG) BY MOUTH DAILY 90 tablet 0   methylPREDNISolone (MEDROL DOSEPAK) 4 mg 6 day dose pack follow package directions (Patient not taking: Reported on 12/19/2023) 21 tablet 0   moxifloxacin (VIGAMOX) 0.5 % ophthalmic solution Instill 1 drop in the RIGHT eye 4 times a day. Start the drops after you arrive home from surgery. 3 mL 1   naproxen (NAPROSYN) 500 mg tablet      phentermine 37.5 mg tab   0   tirzepatide 2.5 mg/0.5 mL pnij Inject under the skin.     traMADoL (ULTRAM) 50 mg tablet      No current facility-administered medications on file prior to visit.  [2] Allergies Allergen Reactions   Oxycontin  [Oxycodone ] GI Intolerance   Penicillins Swelling   Aspirin GI Intolerance  [3] Past Medical History: Diagnosis Date   Arthritis    in back  Cataract    Diabetes mellitus (CMD)    Dry eye syndrome of both eyes    Hypertension    OSA (obstructive sleep apnea)    patient has CPaP, has not used in nine months, after losing ~25lbs.    PVD (posterior vitreous detachment), right eye    Type 2 diabetes mellitus (CMD)   [4] Past Surgical History: Procedure Laterality Date   CARPAL TUNNEL RELEASE Right    Procedure: CARPAL TUNNEL RELEASE   CARPAL TUNNEL RELEASE Left 11/21/2017   Procedure: CARPAL TUNNEL RELEASE;  Surgeon: Davina JINNY Rosebush, MD;  Location: [facility];   Service: Orthopedics;  Laterality: Left;   CATARACT EXTRACTION W/  INTRAOCULAR LENS IMPLANT Right 09/20/2023   Dr Huey CUSTARD , SN # [id number], +21.0 D   CATARACT EXTRACTION W/  INTRAOCULAR LENS IMPLANT Left 10/11/2023   Dr Huey CUSTARD, SN # [id number], +21.0 D   CATARACT EXTRACTION W/ INTRAOCULAR LENS IMPLANT Right 09/20/2023   EXTRACTION CATARACT RIGHT EYE WITH INTRAOCULAR LENS INSERTION performed by Comer Jerilee Huey, MD at [facility] OR   CATARACT EXTRACTION W/ INTRAOCULAR LENS IMPLANT Left 10/11/2023   EXTRACTION CATARACT LEFT EYE WITH INTRAOCULAR LENS INSERTION performed by Comer Jerilee Huey, MD at [facility]   HYSTERECTOMY      Procedure: HYSTERECTOMY; at age 59   TRIGGER FINGER RELEASE Left 08/21/2018   Procedure: TENDON SHEATH INCISION - left trigger thumb;  Surgeon: Davina JINNY Rosebush, MD;  Location: [facility];  Service: Orthopedics;  Laterality: Left;   TRIGGER FINGER RELEASE Right 10/09/2018   Procedure: TENDON SHEATH INCISION - right ring;  Surgeon: Davina JINNY Rosebush, MD;  Location: [facility];  Service: Orthopedics;  Laterality: Right;  [5] Family History Problem Relation Name Age of Onset   Hypertension Mother     Diabetes Mother     Cataracts Mother     Hypertension Father     Cataracts Father     Diabetes Sister     Glaucoma Sister     Diabetes Brother     Cataracts Maternal Grandmother     Cataracts Maternal Grandfather     Cataracts Paternal Grandmother     Cataracts Paternal Grandfather     Macular degeneration Neg Hx    [6] Social History Tobacco Use   Smoking status: Never   Smokeless tobacco: Never  Vaping Use   Vaping status: Never Used  Substance Use Topics   Alcohol use: Yes    Comment: rare   Drug use: Never  "

## 2024-01-25 NOTE — Telephone Encounter (Signed)
 Patient calling about her left knee pain. She states the injection only worked for 3-4 days.   She states it hurts so badly she can barely. Appts are booked until January with Dr. Duwaine and PA's but patient requests to be seen today.    Please assist the patient with her request. She can be reached at: [phone].

## 2024-01-28 NOTE — Telephone Encounter (Signed)
 I called and spoke to Ms. Dobratz and she said that over the weekend she took a purple pill from a girl friend for gout and that seemed to help her the most.  She asked about getting tested for gout and I advised her that she would need to contact her primary care provider since she just had blood work done they may be able to use the sample that she already had drawn. She is going to contact her primary care to see about getting tested and I also advised her that the primary care can treat gout if she does come back positive.

## 2024-02-18 ENCOUNTER — Other Ambulatory Visit (HOSPITAL_BASED_OUTPATIENT_CLINIC_OR_DEPARTMENT_OTHER): Payer: Self-pay

## 2024-02-18 ENCOUNTER — Encounter (HOSPITAL_BASED_OUTPATIENT_CLINIC_OR_DEPARTMENT_OTHER): Payer: Self-pay | Admitting: Emergency Medicine

## 2024-02-18 ENCOUNTER — Emergency Department (HOSPITAL_BASED_OUTPATIENT_CLINIC_OR_DEPARTMENT_OTHER)
Admission: EM | Admit: 2024-02-18 | Discharge: 2024-02-18 | Disposition: A | Discharge status: Home / Self Care | Attending: Emergency Medicine | Admitting: Emergency Medicine

## 2024-02-18 ENCOUNTER — Other Ambulatory Visit: Payer: Self-pay

## 2024-02-18 ENCOUNTER — Emergency Department (HOSPITAL_BASED_OUTPATIENT_CLINIC_OR_DEPARTMENT_OTHER)

## 2024-02-18 DIAGNOSIS — M79641 Pain in right hand: Secondary | ICD-10-CM | POA: Diagnosis not present

## 2024-02-18 DIAGNOSIS — Z79899 Other long term (current) drug therapy: Secondary | ICD-10-CM | POA: Insufficient documentation

## 2024-02-18 DIAGNOSIS — I10 Essential (primary) hypertension: Secondary | ICD-10-CM | POA: Insufficient documentation

## 2024-02-18 DIAGNOSIS — Z7984 Long term (current) use of oral hypoglycemic drugs: Secondary | ICD-10-CM | POA: Diagnosis not present

## 2024-02-18 DIAGNOSIS — E119 Type 2 diabetes mellitus without complications: Secondary | ICD-10-CM | POA: Insufficient documentation

## 2024-02-18 DIAGNOSIS — M25562 Pain in left knee: Secondary | ICD-10-CM | POA: Insufficient documentation

## 2024-02-18 DIAGNOSIS — Z794 Long term (current) use of insulin: Secondary | ICD-10-CM | POA: Diagnosis not present

## 2024-02-18 MED ORDER — PREDNISONE 50 MG PO TABS
60.0000 mg | ORAL_TABLET | Freq: Once | ORAL | Status: AC
  Administered 2024-02-18: 60 mg via ORAL
  Filled 2024-02-18: qty 1

## 2024-02-18 MED ORDER — ACETAMINOPHEN 500 MG PO TABS
1000.0000 mg | ORAL_TABLET | Freq: Once | ORAL | Status: AC
  Administered 2024-02-18: 1000 mg via ORAL
  Filled 2024-02-18: qty 2

## 2024-02-18 MED ORDER — PREDNISONE 10 MG PO TABS
20.0000 mg | ORAL_TABLET | Freq: Every day | ORAL | 0 refills | Status: AC
  Filled 2024-02-18: qty 10, 5d supply, fill #0

## 2024-02-18 NOTE — ED Triage Notes (Signed)
 Pt reports L knee/calf pain for months, and states her middle finger on her right hand keeps locking up. She states she is unable to see her orthopedic doctor until 1/14 and she cannot wait. She has been taking allopurinol and gabapentin without relief. She has not been seen previously for the hand, but states she was told the leg issue is arthritis but she feels that it is more than that.

## 2024-02-18 NOTE — ED Provider Notes (Signed)
 " [facility] Provider Note   CSN: [id number] Arrival date & time: 02/18/24  9374     Patient presents with: Hand and Leg pain   Cindy Ashley is a 73 y.o. female.   Patient here with ongoing pain in her left knee ongoing pain in the right hand but has not really had the hand evaluated in the past.  She has been told she has arthritis.  She follows with orthopedics.  She gets injections in her knee.  She has history of well-controlled diabetes hypertension high cholesterol.  Nothing makes it worse or better.  Denies any fevers or chills.  Denies any trauma.  Symptoms have been ongoing for weeks.  The history is provided by the patient.       Prior to Admission medications  Medication Sig Start Date End Date Taking? Authorizing Provider  predniSONE  (DELTASONE ) 10 MG tablet Take 2 tablets (20 mg total) by mouth daily for 5 days. 02/18/24 02/23/24 Yes Maralyn Witherell, DO  Acyclovir (ZOVIRAX PO) Take by mouth.    [provider]  benzonatate  (TESSALON ) 100 MG capsule Take 1 capsule (100 mg total) by mouth every 8 (eight) hours. 06/09/22   Palumbo, April, MD  cyclobenzaprine  (FLEXERIL ) 10 MG tablet Take 1 tablet (10 mg total) by mouth 3 (three) times daily as needed for muscle spasms. 08/06/21   Dreama Longs, MD  Hydrocodone-Acetaminophen  (VICODIN PO) Take by mouth.    [provider]  insulin glargine (LANTUS) 100 UNIT/ML injection Inject into the skin at bedtime.    [provider]  LISINOPRIL PO Take by mouth.    [provider]  oxyCODONE  (ROXICODONE ) 5 MG immediate release tablet Take 1-2 tablets every 4-6 hours as needed for pain. 07/28/14   Margrette Pear, MD  oxyCODONE -acetaminophen  (PERCOCET/ROXICET) 5-325 MG per tablet Take 1-2 tablets by mouth every 4 (four) hours as needed for pain. 04/23/12   Pickering, Vrinda, NP  Pitavastatin Calcium (LIVALO PO) Take by mouth.    [provider]   SitaGLIPtin-MetFORMIN HCl (JANUMET PO) Take by mouth.    [provider]    Allergies: Penicillins and Aspirin    Review of Systems  Updated Vital Signs BP 131/66 (BP Location: Right Arm)   Pulse 85   Temp 97.9 F (36.6 C) (Oral)   Resp 20   Ht 5' 7 (1.702 m)   Wt 108.4 kg   SpO2 100%   BMI 37.43 kg/m   Physical Exam Vitals and nursing note reviewed.  Constitutional:      General: She is not in acute distress.    Appearance: She is well-developed. She is not ill-appearing.  HENT:     Head: Normocephalic and atraumatic.  Eyes:     Extraocular Movements: Extraocular movements intact.     Conjunctiva/sclera: Conjunctivae normal.     Pupils: Pupils are equal, round, and reactive to light.  Cardiovascular:     Rate and Rhythm: Normal rate and regular rhythm.     Pulses: Normal pulses.     Heart sounds: Normal heart sounds. No murmur heard. Pulmonary:     Effort: Pulmonary effort is normal. No respiratory distress.     Breath sounds: Normal breath sounds.  Abdominal:     General: Abdomen is flat.     Palpations: Abdomen is soft.     Tenderness: There is no abdominal tenderness.  Musculoskeletal:        General: No swelling.     Cervical  back: Normal range of motion and neck supple.     Right lower leg: No edema.     Left lower leg: No edema.     Comments: There is no edema to her legs or any asymmetry to the legs, she is tender to the anterior left knee with no major effusion with good range of motion with no major discomfort, she has no tenderness in the posterior portion the legs including her calf, she had little bit of tenderness to the right middle finger but there is no major erythema swelling redness or any infectious process  Skin:    General: Skin is warm and dry.     Capillary Refill: Capillary refill takes less than 2 seconds.  Neurological:     General: No focal deficit present.     Mental Status: She is alert and oriented to person, place, and  time.     Cranial Nerves: No cranial nerve deficit.     Sensory: No sensory deficit.     Motor: No weakness.     Coordination: Coordination normal.  Psychiatric:        Mood and Affect: Mood normal.     (all labs ordered are listed, but only abnormal results are displayed) Labs Reviewed - No data to display  EKG: None  Radiology: DG Hand Complete Right Result Date: 02/18/2024 EXAM: 3 or more VIEW(S) XRAY OF THE RIGHT HAND 02/18/2024 07:53:00 AM COMPARISON: None available. CLINICAL HISTORY: pain pain FINDINGS: BONES AND JOINTS: Mild diffuse osteopenia. Remote healed deformity of the distal radius and signs of old ulnar styloid fracture. Mild DIP joint osteoarthritis. No acute fracture. No malalignment. SOFT TISSUES: The soft tissues are unremarkable. IMPRESSION: 1. No acute findings. 2. Mild DIP joint osteoarthritis. Electronically signed by: Waddell Calk MD 02/18/2024 08:05 AM EST RP Workstation: [id number]   DG Knee Complete 4 Views Left Result Date: 02/18/2024 EXAM: 4 VIEW(S) XRAY OF THE LEFT KNEE 02/18/2024 07:53:00 AM COMPARISON: None available. CLINICAL HISTORY: pain FINDINGS: BONES AND JOINTS: There is osteopenia. No acute fracture. No malalignment. No significant joint effusion. There is near complete partial joint space loss in the patellofemoral and medial joint compartments with moderate osteophytosis. There is a linear bony density along the upper medial femoral condyle is probably due to remote MCL trauma with healing. Spurring change noted at the tibial spines. Faint meniscal chondrocalcinosis is also seen. SOFT TISSUES: The soft tissues are unremarkable. IMPRESSION: 1. Osteopenia and osteoarthritis without evidence of fractures. 2. Faint meniscal chondrocalcinosis, which can be seen with CPPD arthropathy. 3. Linear bony density along the upper medial femoral condyle, likely due to remote MCL trauma with healing. Electronically signed by: Francis Quam MD 02/18/2024 08:00 AM EST  RP Workstation: [id number]     Procedures   Medications Ordered in the ED - No data to display                                  Medical Decision Making Amount and/or Complexity of Data Reviewed Radiology: ordered.  Risk Prescription drug management.   Melita Villalona is here with right hand and left knee pain.  Normal vitals.  No fever.  History of hypertension high cholesterol diabetes.  Differential diagnosis likely inflammatory arthritis process.  I have no concern for septic joint.  No concern for DVT.  She is neurovascular neuromuscular intact on exam.  Have no concern for arterial process.  She got good pulses  in her arms and legs.  There is no signs of major swelling to either of these areas.  She is following with orthopedics for her left knee.  Has not really had much issues with her right hand until here recently.  Will get x-rays of the right hand and left knee but I do think that this is inflammatory based.  Her diabetes is well-controlled and we will put her on prednisone  for a few days and have her continue follow-up with orthopedics.  Will put her in an Ace wrap for her left knee for comfort.  X-ray consistent with arthritis/inflammatory processes.  Will prescribe steroids follow-up with orthopedics.  Discharged in good condition.  Understands return precautions.  Continue Tylenol  for pain.  This chart was dictated using voice recognition software.  Despite best efforts to proofread,  errors can occur which can change the documentation meaning.      Final diagnoses:  Acute pain of left knee  Right hand pain    ED Discharge Orders          Ordered    predniSONE  (DELTASONE ) 10 MG tablet  Daily        02/18/24 0806               Ruthe Cornet, DO 02/18/24 0809  "

## 2024-02-18 NOTE — Discharge Instructions (Addendum)
 X-rays today did not show any acute fractures or broken bones.  I do think that this is inflammation and arthritis space.  Take prednisone  as prescribed and follow-up with the orthopedic doctor.  Take your next dose of prednisone  tomorrow. use the Ace wrap for comfort.
